# Patient Record
Sex: Female | Born: 1980 | Race: White | Hispanic: No | Marital: Married | Smoking: Former smoker
Health system: Southern US, Community
[De-identification: ages and names within clinical notes are randomized; demographics above are authoritative.]

## PROBLEM LIST (undated history)

## (undated) DIAGNOSIS — G4733 Obstructive sleep apnea (adult) (pediatric): Secondary | ICD-10-CM

## (undated) DIAGNOSIS — I10 Essential (primary) hypertension: Secondary | ICD-10-CM

## (undated) DIAGNOSIS — E119 Type 2 diabetes mellitus without complications: Secondary | ICD-10-CM

## (undated) DIAGNOSIS — R002 Palpitations: Secondary | ICD-10-CM

## (undated) HISTORY — DX: Palpitations: R00.2

## (undated) HISTORY — DX: Obstructive sleep apnea (adult) (pediatric): G47.33

## (undated) HISTORY — DX: Morbid (severe) obesity due to excess calories: E66.01

---

## 2004-09-14 ENCOUNTER — Emergency Department: Payer: Self-pay | Admitting: Emergency Medicine

## 2004-10-15 ENCOUNTER — Emergency Department: Payer: Self-pay | Admitting: Emergency Medicine

## 2004-11-27 ENCOUNTER — Emergency Department: Payer: Self-pay | Admitting: Emergency Medicine

## 2004-12-30 ENCOUNTER — Emergency Department: Payer: Self-pay | Admitting: Emergency Medicine

## 2005-08-08 ENCOUNTER — Emergency Department: Payer: Self-pay | Admitting: General Practice

## 2006-05-14 ENCOUNTER — Emergency Department: Payer: Self-pay | Admitting: Emergency Medicine

## 2006-08-27 ENCOUNTER — Emergency Department: Payer: Self-pay | Admitting: Emergency Medicine

## 2007-02-02 ENCOUNTER — Emergency Department: Payer: Self-pay | Admitting: Emergency Medicine

## 2007-02-14 ENCOUNTER — Emergency Department: Payer: Self-pay | Admitting: Emergency Medicine

## 2007-02-14 ENCOUNTER — Other Ambulatory Visit: Payer: Self-pay

## 2007-05-12 ENCOUNTER — Emergency Department: Payer: Self-pay | Admitting: Internal Medicine

## 2007-05-15 ENCOUNTER — Emergency Department: Payer: Self-pay | Admitting: Emergency Medicine

## 2007-08-30 ENCOUNTER — Emergency Department: Payer: Self-pay | Admitting: Emergency Medicine

## 2007-09-01 ENCOUNTER — Emergency Department: Payer: Self-pay | Admitting: Emergency Medicine

## 2007-11-06 ENCOUNTER — Emergency Department: Payer: Self-pay | Admitting: Emergency Medicine

## 2007-11-27 ENCOUNTER — Emergency Department: Payer: Self-pay | Admitting: Emergency Medicine

## 2008-01-14 ENCOUNTER — Emergency Department: Payer: Self-pay | Admitting: Emergency Medicine

## 2008-03-12 ENCOUNTER — Emergency Department: Payer: Self-pay | Admitting: Emergency Medicine

## 2008-03-26 ENCOUNTER — Ambulatory Visit: Payer: Self-pay | Admitting: Certified Nurse Midwife

## 2008-06-12 ENCOUNTER — Observation Stay: Payer: Self-pay

## 2008-06-17 ENCOUNTER — Observation Stay: Payer: Self-pay | Admitting: Obstetrics and Gynecology

## 2008-06-18 ENCOUNTER — Ambulatory Visit: Payer: Self-pay | Admitting: Obstetrics and Gynecology

## 2008-07-31 ENCOUNTER — Ambulatory Visit: Payer: Self-pay | Admitting: Certified Nurse Midwife

## 2008-08-01 ENCOUNTER — Emergency Department: Payer: Self-pay | Admitting: Emergency Medicine

## 2008-08-01 ENCOUNTER — Observation Stay: Payer: Self-pay | Admitting: Obstetrics and Gynecology

## 2008-08-02 ENCOUNTER — Ambulatory Visit: Payer: Self-pay | Admitting: Certified Nurse Midwife

## 2008-08-03 ENCOUNTER — Observation Stay: Payer: Self-pay | Admitting: Obstetrics and Gynecology

## 2008-08-06 ENCOUNTER — Observation Stay: Payer: Self-pay | Admitting: Obstetrics and Gynecology

## 2008-08-09 ENCOUNTER — Observation Stay: Payer: Self-pay | Admitting: Obstetrics and Gynecology

## 2008-08-18 ENCOUNTER — Observation Stay: Payer: Self-pay

## 2008-08-26 ENCOUNTER — Observation Stay: Payer: Self-pay | Admitting: Obstetrics and Gynecology

## 2008-08-31 ENCOUNTER — Observation Stay: Payer: Self-pay | Admitting: Obstetrics and Gynecology

## 2008-09-02 ENCOUNTER — Ambulatory Visit: Payer: Self-pay

## 2008-09-03 ENCOUNTER — Inpatient Hospital Stay: Payer: Self-pay | Admitting: Obstetrics and Gynecology

## 2008-09-08 ENCOUNTER — Emergency Department: Payer: Self-pay | Admitting: Emergency Medicine

## 2008-10-30 ENCOUNTER — Emergency Department: Payer: Self-pay | Admitting: Emergency Medicine

## 2008-12-30 ENCOUNTER — Inpatient Hospital Stay: Payer: Self-pay | Admitting: Unknown Physician Specialty

## 2009-08-27 ENCOUNTER — Ambulatory Visit: Payer: Self-pay

## 2010-06-02 ENCOUNTER — Ambulatory Visit: Payer: Self-pay | Admitting: Family Medicine

## 2010-06-11 ENCOUNTER — Inpatient Hospital Stay: Payer: Self-pay | Admitting: Obstetrics and Gynecology

## 2010-10-18 ENCOUNTER — Observation Stay: Payer: Self-pay | Admitting: Obstetrics and Gynecology

## 2010-10-27 ENCOUNTER — Ambulatory Visit: Payer: Self-pay | Admitting: Obstetrics and Gynecology

## 2010-10-28 ENCOUNTER — Inpatient Hospital Stay: Payer: Self-pay | Admitting: Obstetrics and Gynecology

## 2012-02-04 ENCOUNTER — Ambulatory Visit: Payer: Self-pay | Admitting: Primary Care

## 2012-06-01 ENCOUNTER — Emergency Department: Payer: Self-pay | Admitting: Emergency Medicine

## 2012-06-29 ENCOUNTER — Emergency Department: Payer: Self-pay | Admitting: Emergency Medicine

## 2015-10-09 ENCOUNTER — Encounter: Payer: Self-pay | Admitting: Emergency Medicine

## 2015-10-09 ENCOUNTER — Emergency Department
Admission: EM | Admit: 2015-10-09 | Discharge: 2015-10-09 | Disposition: A | Discharge status: Home / Self Care | Payer: Self-pay | Attending: Emergency Medicine | Admitting: Emergency Medicine

## 2015-10-09 DIAGNOSIS — F17200 Nicotine dependence, unspecified, uncomplicated: Secondary | ICD-10-CM | POA: Insufficient documentation

## 2015-10-09 DIAGNOSIS — N649 Disorder of breast, unspecified: Secondary | ICD-10-CM

## 2015-10-09 DIAGNOSIS — L03811 Cellulitis of head [any part, except face]: Secondary | ICD-10-CM | POA: Insufficient documentation

## 2015-10-09 DIAGNOSIS — L989 Disorder of the skin and subcutaneous tissue, unspecified: Secondary | ICD-10-CM | POA: Insufficient documentation

## 2015-10-09 DIAGNOSIS — L988 Other specified disorders of the skin and subcutaneous tissue: Secondary | ICD-10-CM

## 2015-10-09 MED ORDER — CEPHALEXIN 500 MG PO CAPS: 500.0000 mg | ORAL_CAPSULE | Freq: Four times a day (QID) | ORAL | Status: DC

## 2015-10-09 NOTE — ED Provider Notes (Signed)
[facility] Emergency Department Provider Note  ____________________________________________  Time seen: Approximately 7:22 PM  I have reviewed the triage vital signs and the nursing notes.   HISTORY  Chief Complaint Abscess    HPI Rose Tate is a 35 y.o. female who presents to emergency department complaining of 3 "bumps" to the back of her head. Patient states that these have developed over the last 2-3 days. They're tender to touch. One area has used a small amount of "clear fluid." Patient denies any fevers or chills, nausea or vomiting, headache, neck pain. Patient denies any history of recurrent skin infections. Patient is not taking any medications prior to arrival.  Patient also endorses a lesion underneath the right breast. Patient states this is been coming and going 5 months. Patient states that area will swell and she will "pop it." Patient states that all go away and then come back. It is always in the same location. Patient denies any swelling or drainage today at this visit. She denies any pain in this area at today's visit. Patient has not noticed any drainage from the nipple. She denies any axillary pain or swelling.   History reviewed. No pertinent past medical history.  There are no active problems to display for this patient.   History reviewed. No pertinent past surgical history.  Current Outpatient Rx  Name  Route  Sig  Dispense  Refill  . cephALEXin (KEFLEX) 500 MG capsule   Oral   Take 1 capsule (500 mg total) by mouth 4 (four) times daily.   28 capsule   0     Allergies Review of patient's allergies indicates no known allergies.  No family history on file.  Social History Social History  Substance Use Topics  . Smoking status: Current Every Day Smoker  . Smokeless tobacco: None  . Alcohol Use: Yes     Review of Systems  Constitutional: No fever/chills Cardiovascular: no chest pain. Respiratory: no cough. No  SOB. Gastrointestinal: No abdominal pain.  No nausea, no vomiting.  No diarrhea.  No constipation. Musculoskeletal: Negative for musculoskeletal pain. Skin: Positive for skin lesions to the back of the head. Positive for recurrent skin lesion underneath right breast. Neurological: Negative for headaches, focal weakness or numbness. 10-point ROS otherwise negative.  ____________________________________________   PHYSICAL EXAM:  VITAL SIGNS: ED Triage Vitals  Enc Vitals Group     BP 10/09/15 1848 143/95 mmHg     Pulse Rate 10/09/15 1848 79     Resp 10/09/15 1848 18     Temp 10/09/15 1848 98.6 F (37 C)     Temp Source 10/09/15 1848 Oral     SpO2 10/09/15 1848 99 %     Weight 10/09/15 1848 250 lb (113.399 kg)     Height 10/09/15 1848 5\' 7"  (1.702 m)     Head Cir --      Peak Flow --      Pain Score 10/09/15 1848 6     Pain Loc --      Pain Edu? --      Excl. in GC? --      Constitutional: Alert and oriented. Well appearing and in no acute distress. Eyes: Conjunctivae are normal. PERRL. EOMI. Head: Atraumatic. Hematological/Lymphatic/Immunilogical: No cervical lymphadenopathy. No axillary lymphadenopathy on the right side. Cardiovascular: Normal rate, regular rhythm. Normal S1 and S2.  Good peripheral circulation. Respiratory: Normal respiratory effort without tachypnea or retractions. Lungs CTAB. Good air entry to the bases with no  decreased or absent breath sounds. Musculoskeletal: Full range of motion to all extremities. No gross deformities appreciated. Neurologic:  Normal speech and language. No gross focal neurologic deficits are appreciated.  Skin:  Skin is warm, dry and intact. Erythematous and edematous skin lesions noted to the occipital region of the skull as well as posterior neck. These are tender to palpation. No fluctuance is noted. No drainage is noted. There is a mild abrasion appearing lesion underneath right breast. No edema, erythema, or drainage is noted.  Palpation reveals no firmness. No fluctuance. Breast exam reveals no lesions or nodules. No discharge from the nipple. Breast exam is performed with chaperone, Nellie, RN. Psychiatric: Mood and affect are normal. Speech and behavior are normal. Patient exhibits appropriate insight and judgement.   ____________________________________________   LABS (all labs ordered are listed, but only abnormal results are displayed)  Labs Reviewed - No data to display ____________________________________________  EKG   ____________________________________________  RADIOLOGY   No results found.  ____________________________________________    PROCEDURES  Procedure(s) performed:       Medications - No data to display   ____________________________________________   INITIAL IMPRESSION / ASSESSMENT AND PLAN / ED COURSE  Pertinent labs & imaging results that were available during my care of the patient were reviewed by me and considered in my medical decision making (see chart for details).  Patient's diagnosis is consistent with cellulitis to the occipital/posterior neck region. This will be treated with antibiotics. At this time there is no erythema or edema underneath right breast to suggest cellulitis or other skin infection. No lesions are identified to palpation of breast tissue on breast exam. Patient is encouraged to follow-up with OB/GYN for this complaint.. Patient will be discharged home with prescriptions for antibiotics for cellulitis. Patient is to follow up with primary care and OB/GYN as needed or otherwise directed. Patient is given ED precautions to return to the ED for any worsening or new symptoms.     ____________________________________________  FINAL CLINICAL IMPRESSION(S) / ED DIAGNOSES  Final diagnoses:  Cellulitis of head except face  Lesion of skin of breast      NEW MEDICATIONS STARTED DURING THIS VISIT:  New Prescriptions   CEPHALEXIN (KEFLEX)  500 MG CAPSULE    Take 1 capsule (500 mg total) by mouth 4 (four) times daily.        This chart was dictated using voice recognition software/Dragon. Despite best efforts to proofread, errors can occur which can change the meaning. Any change was purely unintentional.    Racheal Patches, PA-C 10/09/15 1935  Emily Filbert, MD 10/09/15 2038

## 2015-10-09 NOTE — ED Notes (Signed)
States she noticed several small abscess areas vs insect bites to back of head.neck and under breast

## 2015-10-09 NOTE — Discharge Instructions (Signed)

## 2015-12-30 ENCOUNTER — Encounter: Payer: Self-pay | Admitting: Emergency Medicine

## 2015-12-30 ENCOUNTER — Emergency Department
Admission: EM | Admit: 2015-12-30 | Discharge: 2015-12-30 | Disposition: A | Discharge status: Home / Self Care | Payer: Self-pay | Attending: Emergency Medicine | Admitting: Emergency Medicine

## 2015-12-30 DIAGNOSIS — E119 Type 2 diabetes mellitus without complications: Secondary | ICD-10-CM | POA: Insufficient documentation

## 2015-12-30 DIAGNOSIS — L03811 Cellulitis of head [any part, except face]: Secondary | ICD-10-CM | POA: Insufficient documentation

## 2015-12-30 HISTORY — DX: Type 2 diabetes mellitus without complications: E11.9

## 2015-12-30 MED ORDER — MUPIROCIN 2 % EX OINT: TOPICAL_OINTMENT | CUTANEOUS | 0 refills | Status: DC

## 2015-12-30 MED ORDER — SULFAMETHOXAZOLE-TRIMETHOPRIM 400-80 MG PO TABS: 1.0000 | ORAL_TABLET | Freq: Two times a day (BID) | ORAL | 0 refills | Status: DC

## 2015-12-30 NOTE — ED Notes (Signed)
See triage note.states she developed 3 possible abscess areas to back of head about 2 weeks ago  States the one near her neck is more painful

## 2015-12-30 NOTE — ED Triage Notes (Signed)
Patient reports that she has 3 "knots" on her head that she noticed about 2 weeks ago. Patient report that one of the areas has started causing her pain. Patient states that she was seen here about a month ago for the same and was diagnosed with a staph infection and cellulitis and was given antibiotics. Patient reports that the areas had improved with the antibiotics.

## 2015-12-30 NOTE — Discharge Instructions (Signed)
Take medication as prescribed. Avoid scratchy the area.Keep clean.  Follow up with your primary care physician this week as needed. Return to Urgent care for new or worsening concerns.

## 2015-12-30 NOTE — ED Provider Notes (Signed)
[facility] Emergency Department Provider Note ____________________________________________  Time seen: Approximately 7:38 AM  I have reviewed the triage vital signs and the nursing notes.   HISTORY  Chief Complaint Abscess   HPI Rose Tate is a 35 y.o. female presents today for the complaints of 3 knots to her scalp. Patient reports she has had a history of similar in the past and was diagnosed with cellulitis and staph infection. Patient reports from her last visit in the ER in May she was treated with oral cephalexin and states that the areas did improve but did not fully resolve. Patient reports that she did "pop "one of the areas last week and was able to get a small amount of drainage out.. Patient denies any other drainage. Patient states that the areas are tender to touch. Denies any other pain or injury. Denies any other skin changes. Denies head injury or loss consciousness.  Denies headache, nausea, vomiting, diarrhea, neck pain, vision changes, fevers or chills. Reports continues to eat and drink well.   Patient's last menstrual period was 12/26/2015 (approximate). Denies concerns of pregnancy   Past Medical History:  Diagnosis Date  . Diabetes mellitus without complication (HCC)    pre diabetes    There are no active problems to display for this patient.   Past Surgical History:  Procedure Laterality Date  . CESAREAN SECTION     times 3    No current facility-administered medications for this encounter.   Current Outpatient Prescriptions:  .  cephALEXin (KEFLEX) 500 MG capsule, Take 1 capsule (500 mg total) by mouth 4 (four) times daily., Disp: 28 capsule, Rfl: 0 .  mupirocin ointment (BACTROBAN) 2 %, Apply three times a day for 5 days., Disp: 22 g, Rfl: 0 .  sulfamethoxazole-trimethoprim (BACTRIM) 400-80 MG tablet, Take 1 tablet by mouth 2 (two) times daily., Disp: 20 tablet, Rfl: 0  Allergies Review of patient's allergies  indicates no known allergies.  No family history on file.  Social History Social History  Substance Use Topics  . Smoking status: Current Every Day Smoker  . Smokeless tobacco: Never Used  . Alcohol use Yes     Comment: occasionally    Review of Systems Constitutional: No fever/chills Eyes: No visual changes. ENT: No sore throat. Cardiovascular: Denies chest pain. Respiratory: Denies shortness of breath. Gastrointestinal: No abdominal pain.  No nausea, no vomiting.  No diarrhea.  No constipation. Genitourinary: Negative for dysuria. Musculoskeletal: Negative for back pain. Skin: Negative for rash.As above. Neurological: Negative for headaches, focal weakness or numbness.  10-point ROS otherwise negative.  ____________________________________________   PHYSICAL EXAM:  VITAL SIGNS: ED Triage Vitals [12/30/15 0655]  Enc Vitals Group     BP (!) 174/86     Pulse Rate 84     Resp 18     Temp 98.2 F (36.8 C)     Temp Source Oral     SpO2 96 %     Weight 260 lb (117.9 kg)     Height 5\' 8"  (1.727 m)     Head Circumference      Peak Flow      Pain Score 7     Pain Loc      Pain Edu?      Excl. in GC?    Today's Vitals   12/30/15 0655 12/30/15 0751  BP: (!) 174/86 (!) 142/89  Pulse: 84 75  Resp: 18   Temp: 98.2 F (36.8 C) 98.9 F (37.2 C)  TempSrc: Oral Oral  SpO2: 96% 95%  Weight: 117.9 kg   Height:  (1.727 m)   PainSc: 7       Constitutional: Alert and oriented. Well appearing and in no acute distress. Eyes: Conjunctivae are normal. PERRL. EOMI. ENT      Head: Normocephalic and atraumatic.      Nose: No congestion/rhinnorhea.      Mouth/Throat: Mucous membranes are moist.Oropharynx non-erythematous. Neck: No stridor. Supple without meningismus.  Hematological/Lymphatic/Immunilogical: No cervical lymphadenopathy. Cardiovascular: Normal rate, regular rhythm. Grossly normal heart sounds.  Good peripheral circulation. Respiratory: Normal  respiratory effort without tachypnea nor retractions. Breath sounds are clear and equal bilaterally. No wheezes/rales/rhonchi.. Gastrointestinal: Soft and nontender. No distention. Normal Bowel sounds. No CVA tenderness. Musculoskeletal:  Nontender with normal range of motion in all extremities. No midline cervical, thoracic or lumbar tenderness to palpation.  Neurologic:  Normal speech and language. Speech is normal. No gait instability.  Skin:  Skin is warm, dry and intact. No rash noted. Except: Patient with 1 lesion to anterior scalp and 2 lesions to posterior occiput scalp with mild induration, no fluctuance, no active drainage, mild yellowish crusting on top of each lesion, mild erythema directly at lesion site, minimal tenderness to direct palpation, no cervical tenderness. No incision and drainage indicated. Psychiatric: Mood and affect are normal. Speech and behavior are normal. Patient exhibits appropriate insight and judgment   ___________________________________________   LABS (all labs ordered are listed, but only abnormal results are displayed)  Labs Reviewed - No data to display  PROCEDURES Procedures    INITIAL IMPRESSION / ASSESSMENT AND PLAN / ED COURSE  Pertinent labs & imaging results that were available during my care of the patient were reviewed by me and considered in my medical decision making (see chart for details).  Well-appearing patient. No acute distress. Present for the complaints of scalp lesions. Reports history of similar in the past. Patient reports she has been "picking" at these areas. No active drainage. No indication for I&D. Clinical appearance consistent with lesion with mild cellulitis. Counseled regarding keeping clean and will treat with oral Bactrim as well as topical Bactroban. Encourage close monitoring and PCP follow-up.Discussed indication, risks and benefits of medications with patient.  Discussed follow up with Primary care physician this  week. Discussed follow up and return parameters including no resolution or any worsening concerns. Patient verbalized understanding and agreed to plan.   ____________________________________________   FINAL CLINICAL IMPRESSION(S) / ED DIAGNOSES  Final diagnoses:  Abscess or cellulitis of scalp     Discharge Medication List as of 12/30/2015  7:49 AM    START taking these medications   Details  mupirocin ointment (BACTROBAN) 2 % Apply three times a day for 5 days., Print    sulfamethoxazole-trimethoprim (BACTRIM) 400-80 MG tablet Take 1 tablet by mouth 2 (two) times daily., Starting Tue 12/30/2015, Print        Note: This dictation was prepared with Dragon dictation along with smaller phrase technology. Any transcriptional errors that result from this process are unintentional.    Clinical Course      Renford Dills, NP 12/30/15 [phone]

## 2016-03-03 ENCOUNTER — Encounter: Payer: Self-pay | Admitting: *Deleted

## 2016-03-03 ENCOUNTER — Emergency Department
Admission: EM | Admit: 2016-03-03 | Discharge: 2016-03-03 | Disposition: A | Discharge status: Home / Self Care | Payer: Self-pay | Attending: Emergency Medicine | Admitting: Emergency Medicine

## 2016-03-03 DIAGNOSIS — R42 Dizziness and giddiness: Secondary | ICD-10-CM | POA: Insufficient documentation

## 2016-03-03 DIAGNOSIS — R51 Headache: Secondary | ICD-10-CM | POA: Insufficient documentation

## 2016-03-03 DIAGNOSIS — R110 Nausea: Secondary | ICD-10-CM | POA: Insufficient documentation

## 2016-03-03 DIAGNOSIS — F17200 Nicotine dependence, unspecified, uncomplicated: Secondary | ICD-10-CM | POA: Insufficient documentation

## 2016-03-03 DIAGNOSIS — I10 Essential (primary) hypertension: Secondary | ICD-10-CM | POA: Insufficient documentation

## 2016-03-03 DIAGNOSIS — Z79899 Other long term (current) drug therapy: Secondary | ICD-10-CM | POA: Insufficient documentation

## 2016-03-03 DIAGNOSIS — E119 Type 2 diabetes mellitus without complications: Secondary | ICD-10-CM | POA: Insufficient documentation

## 2016-03-03 NOTE — ED Notes (Signed)
Pt with headache and dizziness for four days along with nausea. Rates headache at a 6/10, describes as pressure. Pt had not tried any OTC meds for headache.

## 2016-03-03 NOTE — ED Triage Notes (Addendum)
Pt reports a headache with nausea/dizziness for 4 days.  Pt states checking blood pressure at home and it has been high.  No hx of htn.  No chest pain.  Pt took no otc meds for headache.  Pt alert.  Speech clear.

## 2016-03-03 NOTE — ED Provider Notes (Signed)
[facility] Emergency Department Provider Note    ____________________________________________   I have reviewed the triage vital signs and the nursing notes.   HISTORY  Chief Complaint Headache; Nausea; and Hypertension   History limited by: Not Limited   HPI Rose Tate is a 35 y.o. female who presents to the emergency department today because of concerns for high blood pressure and feeling unwell. She states that she has felt unwell for the past 3-4 days. She has felt somewhat dizzy. She has also had associated headache. She had been checking her blood pressure at home with her boyfriend's blood pressure cuff. She noted elevated blood pressure 140/100. She had similar readings for the past few days. She denies any chest pain.   Past Medical History:  Diagnosis Date  . Diabetes mellitus without complication (HCC)    pre diabetes    There are no active problems to display for this patient.   Past Surgical History:  Procedure Laterality Date  . CESAREAN SECTION     times 3    Prior to Admission medications   Medication Sig Start Date End Date Taking? Authorizing Provider  cephALEXin (KEFLEX) 500 MG capsule Take 1 capsule (500 mg total) by mouth 4 (four) times daily. 10/09/15   Delorise RoyalsJonathan D Cuthriell, PA-C  mupirocin ointment (BACTROBAN) 2 % Apply three times a day for 5 days. 12/30/15   Renford DillsLindsey Miller, NP  sulfamethoxazole-trimethoprim (BACTRIM) 400-80 MG tablet Take 1 tablet by mouth 2 (two) times daily. 12/30/15   Renford DillsLindsey Miller, NP    Allergies Review of patient's allergies indicates no known allergies.  No family history on file.  Social History Social History  Substance Use Topics  . Smoking status: Current Every Day Smoker  . Smokeless tobacco: Never Used  . Alcohol use Yes     Comment: occasionally    Review of Systems  Constitutional: Negative for fever. Cardiovascular: Negative for chest pain. Respiratory: Negative for  shortness of breath. Gastrointestinal: Negative for abdominal pain, vomiting and diarrhea. Genitourinary: Negative for dysuria. Musculoskeletal: Negative for back pain. Skin: Negative for rash. Neurological: Negative for headaches, focal weakness or numbness.  10-point ROS otherwise negative.  ____________________________________________   PHYSICAL EXAM:  VITAL SIGNS: ED Triage Vitals  Enc Vitals Group     BP 03/03/16 1608 (!) 139/97     Pulse Rate 03/03/16 1608 83     Resp 03/03/16 1608 20     Temp 03/03/16 1608 98 F (36.7 C)     Temp Source 03/03/16 1608 Oral     SpO2 03/03/16 1608 98 %     Weight 03/03/16 1609 260 lb (117.9 kg)     Height 03/03/16 1609 5\' 8"  (1.727 m)     Head Circumference --      Peak Flow --      Pain Score 03/03/16 1609 6   Constitutional: Alert and oriented. Well appearing and in no distress. Eyes: Conjunctivae are normal. Normal extraocular movements. ENT   Head: Normocephalic and atraumatic.   Nose: No congestion/rhinnorhea.   Mouth/Throat: Mucous membranes are moist.   Neck: No stridor. Hematological/Lymphatic/Immunilogical: No cervical lymphadenopathy. Cardiovascular: Normal rate, regular rhythm.  No murmurs, rubs, or gallops. Respiratory: Normal respiratory effort without tachypnea nor retractions. Breath sounds are clear and equal bilaterally. No wheezes/rales/rhonchi. Gastrointestinal: Soft and nontender. No distention.  Genitourinary: Deferred Musculoskeletal: Normal range of motion in all extremities. No lower extremity edema. Neurologic:  Normal speech and language. No gross focal neurologic deficits are appreciated.  Skin:  Skin is warm, dry and intact. No rash noted. Psychiatric: Mood and affect are normal. Speech and behavior are normal. Patient exhibits appropriate insight and judgment.  ____________________________________________    LABS (pertinent positives/negatives)  Labs Reviewed - No data to  display   ____________________________________________   EKG  None  ____________________________________________    RADIOLOGY  None  ____________________________________________   PROCEDURES  Procedures  ____________________________________________   INITIAL IMPRESSION / ASSESSMENT AND PLAN / ED COURSE  Pertinent labs & imaging results that were available during my care of the patient were reviewed by me and considered in my medical decision making (see chart for details).  Patient presented to the emergency department today because of concerns for high blood pressure. Patient's blood pressure in the emergency department mildly elevated. I had a discussion with the patient about goals of care with hypertension. Given the mild elevation I did tell her that we would certainly be hesitant to put her on antihypertensives without diet and exercise attempted first. Will give patient primary care information. Will give patient information on the DASH diet. ____________________________________________   FINAL CLINICAL IMPRESSION(S) / ED DIAGNOSES  Final diagnoses:  Hypertension, unspecified type     Note: This dictation was prepared with Dragon dictation. Any transcriptional errors that result from this process are unintentional    Phineas Semen, MD 03/03/16 1844

## 2016-03-03 NOTE — Discharge Instructions (Signed)
Please seek medical attention for any high fevers, chest pain, shortness of breath, change in behavior, persistent vomiting, bloody stool or any other new or concerning symptoms.  

## 2016-08-11 ENCOUNTER — Emergency Department
Admission: EM | Admit: 2016-08-11 | Discharge: 2016-08-11 | Disposition: A | Discharge status: Home / Self Care | Payer: Self-pay | Attending: Emergency Medicine | Admitting: Emergency Medicine

## 2016-08-11 DIAGNOSIS — W268XXA Contact with other sharp object(s), not elsewhere classified, initial encounter: Secondary | ICD-10-CM | POA: Insufficient documentation

## 2016-08-11 DIAGNOSIS — F17200 Nicotine dependence, unspecified, uncomplicated: Secondary | ICD-10-CM | POA: Insufficient documentation

## 2016-08-11 DIAGNOSIS — Y929 Unspecified place or not applicable: Secondary | ICD-10-CM | POA: Insufficient documentation

## 2016-08-11 DIAGNOSIS — E1165 Type 2 diabetes mellitus with hyperglycemia: Secondary | ICD-10-CM | POA: Insufficient documentation

## 2016-08-11 DIAGNOSIS — Y999 Unspecified external cause status: Secondary | ICD-10-CM | POA: Insufficient documentation

## 2016-08-11 DIAGNOSIS — S61411A Laceration without foreign body of right hand, initial encounter: Secondary | ICD-10-CM | POA: Insufficient documentation

## 2016-08-11 DIAGNOSIS — Y9389 Activity, other specified: Secondary | ICD-10-CM | POA: Insufficient documentation

## 2016-08-11 MED ORDER — LIDOCAINE HCL (PF) 1 % IJ SOLN
INTRAMUSCULAR | Status: AC
  Administered 2016-08-11: 14:00:00
  Filled 2016-08-11: qty 5

## 2016-08-11 MED ORDER — OXYCODONE-ACETAMINOPHEN 5-325 MG PO TABS
1.0000 | ORAL_TABLET | Freq: Once | ORAL | Status: AC
  Administered 2016-08-11: 1 via ORAL
  Filled 2016-08-11: qty 1

## 2016-08-11 MED ORDER — OXYCODONE-ACETAMINOPHEN 5-325 MG PO TABS: 1.0000 | ORAL_TABLET | Freq: Four times a day (QID) | ORAL | 0 refills | Status: DC | PRN

## 2016-08-11 MED ORDER — BACITRACIN ZINC 500 UNIT/GM EX OINT
TOPICAL_OINTMENT | Freq: Once | CUTANEOUS | Status: AC
  Administered 2016-08-11: 1 via TOPICAL
  Filled 2016-08-11: qty 0.9

## 2016-08-11 NOTE — ED Provider Notes (Signed)
[facility] Emergency Department Provider Note   ____________________________________________   First MD Initiated Contact with Patient 08/11/16 1347     (approximate)  I have reviewed the triage vital signs and the nursing notes.   HISTORY  Chief Complaint Laceration    HPI Rose Tate is a 36 y.o. female laceration right hand. Patient states cut while washing a mild consent. Patient stated leading controlled with direct pressure. Patient denies loss of sensation or loss of function. Patient is right-hand dominant.Patient describes the pain as "achy". Patient is unsure tetanus status.   Past Medical History:  Diagnosis Date  . Diabetes mellitus without complication (HCC)    pre diabetes    There are no active problems to display for this patient.   Past Surgical History:  Procedure Laterality Date  . CESAREAN SECTION     times 3    Prior to Admission medications   Medication Sig Start Date End Date Taking? Authorizing Provider  cephALEXin (KEFLEX) 500 MG capsule Take 1 capsule (500 mg total) by mouth 4 (four) times daily. 10/09/15   Delorise RoyalsJonathan D Cuthriell, PA-C  mupirocin ointment (BACTROBAN) 2 % Apply three times a day for 5 days. 12/30/15   Renford DillsLindsey Miller, NP  oxyCODONE-acetaminophen (ROXICET) 5-325 MG tablet Take 1 tablet by mouth every 6 (six) hours as needed for moderate pain. 08/11/16   Joni Reiningonald K Sharina Petre, PA-C  sulfamethoxazole-trimethoprim (BACTRIM) 400-80 MG tablet Take 1 tablet by mouth 2 (two) times daily. 12/30/15   Renford DillsLindsey Miller, NP    Allergies Patient has no known allergies.  History reviewed. No pertinent family history.  Social History Social History  Substance Use Topics  . Smoking status: Current Every Day Smoker  . Smokeless tobacco: Never Used  . Alcohol use Yes     Comment: occasionally    Review of Systems Constitutional: No fever/chills Eyes: No visual changes. ENT: No sore throat. Cardiovascular: Denies  chest pain. Respiratory: Denies shortness of breath. Gastrointestinal: No abdominal pain.  No nausea, no vomiting.  No diarrhea.  No constipation. Genitourinary: Negative for dysuria. Musculoskeletal: Negative for back pain. Skin: Negative for rash. Right hand laceration Neurological: Negative for headaches, focal weakness or numbness. Endocrine:Diabetes ____________________________________________   PHYSICAL EXAM:  VITAL SIGNS: ED Triage Vitals  Enc Vitals Group     BP 08/11/16 1321 (!) 135/98     Pulse Rate 08/11/16 1321 83     Resp 08/11/16 1321 18     Temp 08/11/16 1321 97.9 F (36.6 C)     Temp Source 08/11/16 1321 Oral     SpO2 08/11/16 1321 97 %     Weight 08/11/16 1322 260 lb (117.9 kg)     Height 08/11/16 1320 5\' 8"  (1.727 m)     Head Circumference --      Peak Flow --      Pain Score 08/11/16 1320 2     Pain Loc --      Pain Edu? --      Excl. in GC? --     Constitutional: Alert and oriented. Well appearing and in no acute distress. Eyes: Conjunctivae are normal. PERRL. EOMI. Head: Atraumatic. Nose: No congestion/rhinnorhea. Mouth/Throat: Mucous membranes are moist.  Oropharynx non-erythematous. Neck: No stridor.  No cervical spine tenderness to palpation. Hematological/Lymphatic/Immunilogical: No cervical lymphadenopathy. Cardiovascular: Normal rate, regular rhythm. Grossly normal heart sounds.  Good peripheral circulation. Respiratory: Normal respiratory effort.  No retractions. Lungs CTAB. Gastrointestinal: Soft and nontender. No distention. No abdominal bruits. No  CVA tenderness. Musculoskeletal: No lower extremity tenderness nor edema.  No joint effusions. Neurologic:  Normal speech and language. No gross focal neurologic deficits are appreciated. No gait instability. Skin:  Skin is warm, dry and intact. No rash noted. Right hand laceration Psychiatric: Mood and affect are normal. Speech and behavior are  normal.  ____________________________________________   LABS (all labs ordered are listed, but only abnormal results are displayed)  Labs Reviewed - No data to display ____________________________________________  EKG   ____________________________________________  RADIOLOGY   ____________________________________________   PROCEDURES  Procedure(s) performed: LACERATION REPAIR Performed by: Joni Reining Authorized by: Joni Reining Consent: Verbal consent obtained. Risks and benefits: risks, benefits and alternatives were discussed Consent given by: patient Patient identity confirmed: provided demographic data Prepped and Draped in normal sterile fashion Wound explored  Laceration Location: Right hand  Laceration Length: 2cm  No Foreign Bodies seen or palpated  Anesthesia: local infiltration  Local anesthetic: lidocaine 1% without epinephrine  Anesthetic total: 4ml  Irrigation method: syringe Amount of cleaning: standard  Skin closure: 4-0 nylon   Number of sutures: 8 Technique: Interrupted Patient tolerance: Patient tolerated the procedure well with no immediate complications.   Procedures  Critical Care performed: No  ____________________________________________   INITIAL IMPRESSION / ASSESSMENT AND PLAN / ED COURSE  Pertinent labs & imaging results that were available during my care of the patient were reviewed by me and considered in my medical decision making (see chart for details).  Right hand laceration. Patient given discharge care instructions. Patient given a prescription for Percocet. Patient advised to have sutures removed in 10 days by her family doctor or this department. Given work note. Patient given a prescription for 3 days of Percocet      ____________________________________________   FINAL CLINICAL IMPRESSION(S) / ED DIAGNOSES  Final diagnoses:  Laceration of right hand without foreign body, initial encounter       NEW MEDICATIONS STARTED DURING THIS VISIT:  New Prescriptions   OXYCODONE-ACETAMINOPHEN (ROXICET) 5-325 MG TABLET    Take 1 tablet by mouth every 6 (six) hours as needed for moderate pain.     Note:  This document was prepared using Dragon voice recognition software and may include unintentional dictation errors.    Joni Reining, PA-C 08/11/16 1421    Jennye Moccasin, MD 08/11/16 [phone]

## 2016-08-11 NOTE — ED Triage Notes (Signed)
Pt was washing a mug and cut R hand. Pt has saturated paper towel on R hand. Unsure of tetanus.

## 2016-08-19 ENCOUNTER — Emergency Department
Admission: EM | Admit: 2016-08-19 | Discharge: 2016-08-19 | Disposition: A | Discharge status: Home / Self Care | Payer: Self-pay | Attending: Emergency Medicine | Admitting: Emergency Medicine

## 2016-08-19 ENCOUNTER — Encounter: Payer: Self-pay | Admitting: Emergency Medicine

## 2016-08-19 DIAGNOSIS — Z4802 Encounter for removal of sutures: Secondary | ICD-10-CM | POA: Insufficient documentation

## 2016-08-19 DIAGNOSIS — E119 Type 2 diabetes mellitus without complications: Secondary | ICD-10-CM | POA: Insufficient documentation

## 2016-08-19 DIAGNOSIS — F17200 Nicotine dependence, unspecified, uncomplicated: Secondary | ICD-10-CM | POA: Insufficient documentation

## 2016-08-19 NOTE — ED Notes (Signed)

## 2016-08-19 NOTE — ED Triage Notes (Signed)
Pt had stitches placed one week ago.  Here for removal. No site complications.

## 2016-08-19 NOTE — ED Provider Notes (Signed)
[facility] Emergency Department Provider Note  ____________________________________________  Time seen: Approximately 5:28 PM  I have reviewed the triage vital signs and the nursing notes.   HISTORY  Chief Complaint Suture / Staple Removal    HPI Rose BillRachael Tate Mariah MillingMorales is a 36 y.o. female who presents to emergency room complaining of laceration to the right thumb and second digit. This was closed in this department 8 days prior. Patient denies any complications with wound. Area is healing appropriately. No signs of infection. Patient is here for suture removal.   Past Medical History:  Diagnosis Date  . Diabetes mellitus without complication (HCC)    pre diabetes    There are no active problems to display for this patient.   Past Surgical History:  Procedure Laterality Date  . CESAREAN SECTION     times 3    Prior to Admission medications   Medication Sig Start Date End Date Taking? Authorizing Provider  cephALEXin (KEFLEX) 500 MG capsule Take 1 capsule (500 mg total) by mouth 4 (four) times daily. 10/09/15   Delorise RoyalsJonathan D Eliane Hammersmith, PA-C  mupirocin ointment (BACTROBAN) 2 % Apply three times a day for 5 days. 12/30/15   Renford DillsLindsey Miller, NP  oxyCODONE-acetaminophen (ROXICET) 5-325 MG tablet Take 1 tablet by mouth every 6 (six) hours as needed for moderate pain. 08/11/16   Joni Reiningonald K Smith, PA-C  sulfamethoxazole-trimethoprim (BACTRIM) 400-80 MG tablet Take 1 tablet by mouth 2 (two) times daily. 12/30/15   Renford DillsLindsey Miller, NP    Allergies Patient has no known allergies.  History reviewed. No pertinent family history.  Social History Social History  Substance Use Topics  . Smoking status: Current Every Day Smoker  . Smokeless tobacco: Never Used  . Alcohol use Yes     Comment: occasionally     Review of Systems  Constitutional: No fever/chills Cardiovascular: no chest pain. Respiratory: no cough. No SOB. Musculoskeletal: Negative for musculoskeletal  pain. Skin: Negative for rash, abrasions, lacerations, ecchymosis.Suture removal from thumb and second digit right hand Neurological: Negative for headaches, focal weakness or numbness. 10-point ROS otherwise negative.  ____________________________________________   PHYSICAL EXAM:  VITAL SIGNS: ED Triage Vitals  Enc Vitals Group     BP 08/19/16 1700 130/86     Pulse Rate 08/19/16 1700 80     Resp 08/19/16 1700 20     Temp 08/19/16 1700 98.7 F (37.1 C)     Temp Source 08/19/16 1700 Oral     SpO2 08/19/16 1700 98 %     Weight 08/19/16 1701 260 lb (117.9 kg)     Height 08/19/16 1701 5\' 8"  (1.727 m)     Head Circumference --      Peak Flow --      Pain Score 08/19/16 1702 4     Pain Loc --      Pain Edu? --      Excl. in GC? --      Constitutional: Alert and oriented. Well appearing and in no acute distress. Eyes: Conjunctivae are normal. PERRL. EOMI. Head: Atraumatic. Neck: No stridor.    Cardiovascular: Normal rate, regular rhythm. Normal S1 and S2.  Good peripheral circulation. Respiratory: Normal respiratory effort without tachypnea or retractions. Lungs CTAB. Good air entry to the bases with no decreased or absent breath sounds. Musculoskeletal: Full range of motion to all extremities. No gross deformities appreciated. Neurologic:  Normal speech and language. No gross focal neurologic deficits are appreciated.  Skin:  Skin is warm, dry and  intact. No rash noted.2 small lacerations noted. 1 to the thumb, one to second digit right hand. These are healing appropriately with no signs of infection. No drainage. Area is nontender to palpation. 8 sutures in place. Psychiatric: Mood and affect are normal. Speech and behavior are normal. Patient exhibits appropriate insight and judgement.   ____________________________________________   LABS (all labs ordered are listed, but only abnormal results are displayed)  Labs Reviewed - No data to  display ____________________________________________  EKG   ____________________________________________  RADIOLOGY   No results found.  ____________________________________________    PROCEDURES  Procedure(s) performed:    .Suture Removal Date/Time: 08/19/2016 5:42 PM Performed by: Gala Romney D Authorized by: Gala Romney D   Consent:    Consent obtained:  Verbal   Consent given by:  Patient   Risks discussed:  Pain Location:    Location:  Upper extremity   Upper extremity location:  Hand Procedure details:    Wound appearance:  Good wound healing   Number of sutures removed:  8 Post-procedure details:    Post-removal:  No dressing applied   Patient tolerance of procedure:  Tolerated well, no immediate complications      Medications - No data to display   ____________________________________________   INITIAL IMPRESSION / ASSESSMENT AND PLAN / ED COURSE  Pertinent labs & imaging results that were available during my care of the patient were reviewed by me and considered in my medical decision making (see chart for details).  Review of the [place] CSRS was performed in accordance of the [facility] prior to dispensing any controlled drugs.     Patient's diagnosis is consistent with suture removal. All sutures are removed. No signs of dehiscence or infection. No further follow-up necessary in the emergency department. Patient will follow-up with primary care as needed..  Patient is given ED precautions to return to the ED for any worsening or new symptoms.     ____________________________________________  FINAL CLINICAL IMPRESSION(S) / ED DIAGNOSES  Final diagnoses:  Visit for suture removal      NEW MEDICATIONS STARTED DURING THIS VISIT:  New Prescriptions   No medications on file        This chart was dictated using voice recognition software/Dragon. Despite best efforts to proofread, errors can occur which can change the  meaning. Any change was purely unintentional.    Racheal Patches, PA-C 08/19/16 1743    Merrily Brittle, MD 08/19/16 1901

## 2017-07-16 ENCOUNTER — Emergency Department
Admission: EM | Admit: 2017-07-16 | Discharge: 2017-07-17 | Disposition: A | Discharge status: Home / Self Care | Payer: Self-pay | Attending: Emergency Medicine | Admitting: Emergency Medicine

## 2017-07-16 ENCOUNTER — Encounter: Payer: Self-pay | Admitting: Emergency Medicine

## 2017-07-16 ENCOUNTER — Other Ambulatory Visit: Payer: Self-pay

## 2017-07-16 DIAGNOSIS — E119 Type 2 diabetes mellitus without complications: Secondary | ICD-10-CM | POA: Insufficient documentation

## 2017-07-16 DIAGNOSIS — Z79899 Other long term (current) drug therapy: Secondary | ICD-10-CM | POA: Insufficient documentation

## 2017-07-16 DIAGNOSIS — F17200 Nicotine dependence, unspecified, uncomplicated: Secondary | ICD-10-CM | POA: Insufficient documentation

## 2017-07-16 DIAGNOSIS — R52 Pain, unspecified: Secondary | ICD-10-CM

## 2017-07-16 DIAGNOSIS — R42 Dizziness and giddiness: Secondary | ICD-10-CM | POA: Insufficient documentation

## 2017-07-16 DIAGNOSIS — I10 Essential (primary) hypertension: Secondary | ICD-10-CM | POA: Insufficient documentation

## 2017-07-16 DIAGNOSIS — R102 Pelvic and perineal pain: Secondary | ICD-10-CM | POA: Insufficient documentation

## 2017-07-16 LAB — CBC
HEMATOCRIT: 37 % (ref 35.0–47.0)
Hemoglobin: 11.8 g/dL — ABNORMAL LOW (ref 12.0–16.0)
MCH: 23 pg — ABNORMAL LOW (ref 26.0–34.0)
MCHC: 31.8 g/dL — ABNORMAL LOW (ref 32.0–36.0)
MCV: 72.2 fL — ABNORMAL LOW (ref 80.0–100.0)
PLATELETS: 302 10*3/uL (ref 150–440)
RBC: 5.12 MIL/uL (ref 3.80–5.20)
RDW: 17.1 % — AB (ref 11.5–14.5)
WBC: 5 10*3/uL (ref 3.6–11.0)

## 2017-07-16 LAB — COMPREHENSIVE METABOLIC PANEL
ALK PHOS: 69 U/L (ref 38–126)
ALT: 25 U/L (ref 14–54)
AST: 29 U/L (ref 15–41)
Albumin: 4.1 g/dL (ref 3.5–5.0)
Anion gap: 9 (ref 5–15)
BILIRUBIN TOTAL: 0.4 mg/dL (ref 0.3–1.2)
BUN: 10 mg/dL (ref 6–20)
CALCIUM: 8.6 mg/dL — AB (ref 8.9–10.3)
CO2: 27 mmol/L (ref 22–32)
Chloride: 103 mmol/L (ref 101–111)
Creatinine, Ser: 0.77 mg/dL (ref 0.44–1.00)
GFR calc Af Amer: >60 mL/min (ref >60)
Glucose, Bld: 166 mg/dL — ABNORMAL HIGH (ref 65–99)
POTASSIUM: 3.8 mmol/L (ref 3.5–5.1)
Sodium: 139 mmol/L (ref 135–145)
TOTAL PROTEIN: 7.2 g/dL (ref 6.5–8.1)

## 2017-07-16 LAB — URINALYSIS, COMPLETE (UACMP) WITH MICROSCOPIC
BILIRUBIN URINE: NEGATIVE
GLUCOSE, UA: NEGATIVE mg/dL
HGB URINE DIPSTICK: NEGATIVE
Ketones, ur: NEGATIVE mg/dL
LEUKOCYTES UA: NEGATIVE
NITRITE: NEGATIVE
PH: 5 (ref 5.0–8.0)
Protein, ur: NEGATIVE mg/dL
Specific Gravity, Urine: 1.017 (ref 1.005–1.030)

## 2017-07-16 LAB — LIPASE, BLOOD: Lipase: 25 U/L (ref 11–51)

## 2017-07-16 LAB — PREGNANCY, URINE: PREG TEST UR: NEGATIVE

## 2017-07-16 NOTE — ED Notes (Signed)
Called Lab to inqquire on Lipase. Lab tech states they are running it now.

## 2017-07-16 NOTE — ED Triage Notes (Signed)
Pt ambulatory to triage in NAD, reports lower abd/pelvic pain, dark urine, malodorous urine ongoing for a year, but about 2 days ago developed dizziness.

## 2017-07-16 NOTE — ED Provider Notes (Signed)
[facility] Emergency Department Provider Note   ____________________________________________   First MD Initiated Contact with Patient 07/16/17 2326     (approximate)  I have reviewed the triage vital signs and the nursing notes.   HISTORY  Chief Complaint Dizziness and Abdominal Pain    HPI Rose Tate is a 37 y.o. female who presents to the ED from home with a chief complaint of pelvic pain, malodorous urine and dizziness.  Patient reports pelvic pain and malodorous urine for about 1 year.  Presents tonight because 2 days ago she developed dizziness which she describes as spinning sensation exacerbated by head movement.  Has had sinus congestion and pressure this week.  Denies associated fever, chills, chest pain, shortness of breath, abdominal pain, nausea, vomiting, diarrhea.  Denies recent travel or trauma.  Denies vaginal discharge or bleeding.  States last menstrual.  Last month was heavy with clots.  Practices sexual abstinence; last sexual intercourse 2 years ago.   Past Medical History:  Diagnosis Date  . Diabetes mellitus without complication (HCC)    pre diabetes    There are no active problems to display for this patient.   Past Surgical History:  Procedure Laterality Date  . CESAREAN SECTION     times 3    Prior to Admission medications   Medication Sig Start Date End Date Taking? Authorizing Provider  cephALEXin (KEFLEX) 500 MG capsule Take 1 capsule (500 mg total) by mouth 4 (four) times daily. 10/09/15   Cuthriell, Delorise Royals, PA-C  mupirocin ointment (BACTROBAN) 2 % Apply three times a day for 5 days. 12/30/15   Renford Dills, NP  oxyCODONE-acetaminophen (ROXICET) 5-325 MG tablet Take 1 tablet by mouth every 6 (six) hours as needed for moderate pain. 08/11/16   Joni Reining, PA-C  sulfamethoxazole-trimethoprim (BACTRIM) 400-80 MG tablet Take 1 tablet by mouth 2 (two) times daily. 12/30/15   Renford Dills, NP     Allergies Patient has no known allergies.  History reviewed. No pertinent family history.  Social History Social History   Tobacco Use  . Smoking status: Current Every Day Smoker  . Smokeless tobacco: Never Used  Substance Use Topics  . Alcohol use: Yes    Comment: occasionally  . Drug use: No    Review of Systems   Constitutional: No fever/chills. Eyes: No visual changes. ENT: No sore throat. Cardiovascular: Denies chest pain. Respiratory: Denies shortness of breath. Gastrointestinal: Positive for pelvic pain.  No abdominal pain.  No nausea, no vomiting.  No diarrhea.  No constipation. Genitourinary: Negative for dysuria. Musculoskeletal: Negative for back pain. Skin: Negative for rash. Neurological: Positive for dizziness.  Negative for headaches, focal weakness or numbness.   ____________________________________________   PHYSICAL EXAM:  VITAL SIGNS: ED Triage Vitals  Enc Vitals Group     BP 07/16/17 1928 (!) 185/112     Pulse Rate 07/16/17 1928 95     Resp 07/16/17 1928 16     Temp 07/16/17 1928 98.3 F (36.8 C)     Temp Source 07/16/17 1928 Oral     SpO2 07/16/17 1928 100 %     Weight 07/16/17 1929 267 lb (121.1 kg)     Height 07/16/17 1929 5\' 8"  (1.727 m)     Head Circumference --      Peak Flow --      Pain Score 07/16/17 1929 4     Pain Loc --      Pain Edu? --  Excl. in GC? --     Constitutional: Alert and oriented. Well appearing and in no acute distress. Eyes: Conjunctivae are normal. PERRL. EOMI. Head: Atraumatic.  Bilateral maxillary sinuses mildly tender to palpation. Ears: Mild fluid behind bilateral TMs. Nose: No congestion/rhinnorhea. Mouth/Throat: Mucous membranes are moist.  Oropharynx non-erythematous. Neck: No stridor.  No carotid bruits. Cardiovascular: Normal rate, regular rhythm. Grossly normal heart sounds.  Good peripheral circulation. Respiratory: Normal respiratory effort.  No retractions. Lungs  CTAB. Gastrointestinal: Obese.  Soft and nontender to light or deep palpation. No distention. No abdominal bruits. No CVA tenderness. Musculoskeletal: No lower extremity tenderness nor edema.  No joint effusions. Neurologic:  Normal speech and language. No gross focal neurologic deficits are appreciated. No gait instability. Skin:  Skin is warm, dry and intact. No rash noted. Psychiatric: Mood and affect are normal. Speech and behavior are normal.  ____________________________________________   LABS (all labs ordered are listed, but only abnormal results are displayed)  Labs Reviewed  WET PREP, GENITAL - Abnormal; Notable for the following components:      Result Value   WBC, Wet Prep HPF POC FEW (*)    All other components within normal limits  COMPREHENSIVE METABOLIC PANEL - Abnormal; Notable for the following components:   Glucose, Bld 166 (*)    Calcium 8.6 (*)    All other components within normal limits  CBC - Abnormal; Notable for the following components:   Hemoglobin 11.8 (*)    MCV 72.2 (*)    MCH 23.0 (*)    MCHC 31.8 (*)    RDW 17.1 (*)    All other components within normal limits  URINALYSIS, COMPLETE (UACMP) WITH MICROSCOPIC - Abnormal; Notable for the following components:   Color, Urine YELLOW (*)    APPearance HAZY (*)    Bacteria, UA RARE (*)    Squamous Epithelial / LPF 6-30 (*)    All other components within normal limits  CHLAMYDIA/NGC RT PCR ([facility] ONLY)  URINE CULTURE  LIPASE, BLOOD  PREGNANCY, URINE  POC URINE PREG, ED   ____________________________________________  EKG  ED ECG REPORT I, Thoma Paulsen J, the attending physician, personally viewed and interpreted this ECG.   Date: 07/16/2017  EKG Time: 1927  Rate: 96  Rhythm: normal EKG, normal sinus rhythm  Axis: Normal  Intervals:none  ST&T Change: Nonspecific  ____________________________________________  RADIOLOGY  ED MD interpretation: Normal ultrasound  Official radiology  report(s): [place] Pelvis Transvanginal Non-ob (tv Only)  Result Date: 07/17/2017 CLINICAL DATA:  Lower abdominal/pelvic pain. History of ovarian cysts. EXAM: TRANSABDOMINAL AND TRANSVAGINAL ULTRASOUND OF PELVIS DOPPLER ULTRASOUND OF OVARIES TECHNIQUE: Both transabdominal and transvaginal ultrasound examinations of the pelvis were performed. Transabdominal technique was performed for global imaging of the pelvis including uterus, ovaries, adnexal regions, and pelvic cul-de-sac. It was necessary to proceed with endovaginal exam following the transabdominal exam to visualize the uterus and ovaries. Color and duplex Doppler ultrasound was utilized to evaluate blood flow to the ovaries. Transvaginal exam attempted, but terminated the for completion by the patient due to discomfort. COMPARISON:  None. FINDINGS: Uterus Measurements: 12.3 x 5.7 x 6.7 cm. No fibroids or other mass visualized. Endometrium Thickness: 6 mm.  No focal abnormality visualized. Right ovary Measurements: 4.5 x 3.4 x 3.2 cm. Physiologic follicles. Normal appearance/no adnexal mass. No ovarian cysts. Left ovary Measurements: 3.2 x 2.4 x 2.2 cm. Normal appearance/no adnexal mass. Pulsed Doppler evaluation of both ovaries demonstrates normal low-resistance arterial and venous waveforms. Other findings No abnormal free  fluid. Technically limited exam due to body habitus. IMPRESSION: 1. No ovarian cyst or torsion. Normal sonographic appearance of both ovaries. 2. Prominent sized uterus but without focal abnormality. Electronically Signed   By: Rubye Oaks M.D.   On: 07/17/2017 01:18   US Pelvis Complete  Result Date: 07/17/2017 CLINICAL DATA:  Lower abdominal/pelvic pain. History of ovarian cysts. EXAM: TRANSABDOMINAL AND TRANSVAGINAL ULTRASOUND OF PELVIS DOPPLER ULTRASOUND OF OVARIES TECHNIQUE: Both transabdominal and transvaginal ultrasound examinations of the pelvis were performed. Transabdominal technique was performed for global imaging of  the pelvis including uterus, ovaries, adnexal regions, and pelvic cul-de-sac. It was necessary to proceed with endovaginal exam following the transabdominal exam to visualize the uterus and ovaries. Color and duplex Doppler ultrasound was utilized to evaluate blood flow to the ovaries. Transvaginal exam attempted, but terminated the for completion by the patient due to discomfort. COMPARISON:  None. FINDINGS: Uterus Measurements: 12.3 x 5.7 x 6.7 cm. No fibroids or other mass visualized. Endometrium Thickness: 6 mm.  No focal abnormality visualized. Right ovary Measurements: 4.5 x 3.4 x 3.2 cm. Physiologic follicles. Normal appearance/no adnexal mass. No ovarian cysts. Left ovary Measurements: 3.2 x 2.4 x 2.2 cm. Normal appearance/no adnexal mass. Pulsed Doppler evaluation of both ovaries demonstrates normal low-resistance arterial and venous waveforms. Other findings No abnormal free fluid. Technically limited exam due to body habitus. IMPRESSION: 1. No ovarian cyst or torsion. Normal sonographic appearance of both ovaries. 2. Prominent sized uterus but without focal abnormality. Electronically Signed   By: Rubye Oaks M.D.   On: 07/17/2017 01:18   US Pelvic Doppler (torsion R/o Or Mass Arterial Flow)  Result Date: 07/17/2017 CLINICAL DATA:  Lower abdominal/pelvic pain. History of ovarian cysts. EXAM: TRANSABDOMINAL AND TRANSVAGINAL ULTRASOUND OF PELVIS DOPPLER ULTRASOUND OF OVARIES TECHNIQUE: Both transabdominal and transvaginal ultrasound examinations of the pelvis were performed. Transabdominal technique was performed for global imaging of the pelvis including uterus, ovaries, adnexal regions, and pelvic cul-de-sac. It was necessary to proceed with endovaginal exam following the transabdominal exam to visualize the uterus and ovaries. Color and duplex Doppler ultrasound was utilized to evaluate blood flow to the ovaries. Transvaginal exam attempted, but terminated the for completion by the patient due  to discomfort. COMPARISON:  None. FINDINGS: Uterus Measurements: 12.3 x 5.7 x 6.7 cm. No fibroids or other mass visualized. Endometrium Thickness: 6 mm.  No focal abnormality visualized. Right ovary Measurements: 4.5 x 3.4 x 3.2 cm. Physiologic follicles. Normal appearance/no adnexal mass. No ovarian cysts. Left ovary Measurements: 3.2 x 2.4 x 2.2 cm. Normal appearance/no adnexal mass. Pulsed Doppler evaluation of both ovaries demonstrates normal low-resistance arterial and venous waveforms. Other findings No abnormal free fluid. Technically limited exam due to body habitus. IMPRESSION: 1. No ovarian cyst or torsion. Normal sonographic appearance of both ovaries. 2. Prominent sized uterus but without focal abnormality. Electronically Signed   By: Rubye Oaks M.D.   On: 07/17/2017 01:18    ____________________________________________   PROCEDURES  Procedure(s) performed:   Pelvic exam: External exam within normal limits without rashes, lesions or vesicles.  Speculum exam reveals scant white discharge.  Bimanual exam deferred because patient could barely tolerate speculum exam as she has been abstinent for 2 years.  Procedures  Critical Care performed: No  ____________________________________________   INITIAL IMPRESSION / ASSESSMENT AND PLAN / ED COURSE  As part of my medical decision making, I reviewed the following data within the electronic MEDICAL RECORD NUMBER Nursing notes reviewed and incorporated, Labs reviewed, Radiograph reviewed and  Notes from prior ED visits.   37 year old female who presents with chronic pelvic pain and dizziness. Differential diagnosis includes, but is not limited to, ovarian cyst, ovarian torsion, acute appendicitis, diverticulitis, urinary tract infection/pyelonephritis, endometriosis, bowel obstruction, colitis, renal colic, gastroenteritis, hernia, fibroids, endometriosis, pregnancy related pain including ectopic pregnancy, etc. Differential diagnosis  includes, but is not limited to, intracranial hemorrhage, meningitis/encephalitis, previous head trauma, cavernous venous thrombosis, tension headache, temporal arteritis, migraine or migraine equivalent, idiopathic intracranial hypertension, and non-specific headache.  Patient is neurologically intact without focal deficits.  Clinically dizziness sounds suspicious for vertigo.  Will obtain orthostatic vital signs.  Patient declines IV placement.  As for chronic pelvic pain, will attempt pelvic ultrasound to evaluate for pelvic pathology.  Updated patient of laboratory and urinalysis results which are unremarkable.   Clinical Course as of Jul 17 352  Wynelle Link Jul 17, 2017  0337 Blood pressure improved.  Patient feeling better.  Patient states she has noted elevated blood pressure when she has been to the emergency department before.  Will start her on low-dose antihypertensive.  Patient will follow up with her PCP next week.  Strict return precautions given.  Patient verbalizes understanding and agrees with plan of care.  [JS]  0354 Z-Pak prescribed per patient's request for her sinus infection.  [JS]    Clinical Course User Index [JS] Irean Hong, MD     ____________________________________________   FINAL CLINICAL IMPRESSION(S) / ED DIAGNOSES  Final diagnoses:  Pain  Dizziness  Pelvic pain in female  Essential hypertension     ED Discharge Orders    None       Note:  This document was prepared using Dragon voice recognition software and may include unintentional dictation errors.    Irean Hong, MD 07/17/17 [phone]

## 2017-07-16 NOTE — ED Notes (Signed)
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

## 2017-07-17 ENCOUNTER — Emergency Department: Payer: Self-pay

## 2017-07-17 LAB — WET PREP, GENITAL
Clue Cells Wet Prep HPF POC: NONE SEEN
SPERM: NONE SEEN
TRICH WET PREP: NONE SEEN
YEAST WET PREP: NONE SEEN

## 2017-07-17 LAB — CHLAMYDIA/NGC RT PCR (ARMC ONLY)
Chlamydia Tr: NOT DETECTED
N gonorrhoeae: NOT DETECTED

## 2017-07-17 MED ORDER — ONDANSETRON 4 MG PO TBDP
4.0000 mg | ORAL_TABLET | Freq: Once | ORAL | Status: AC
  Administered 2017-07-17: 4 mg via ORAL
  Filled 2017-07-17: qty 1

## 2017-07-17 MED ORDER — MECLIZINE HCL 25 MG PO TABS
50.0000 mg | ORAL_TABLET | Freq: Once | ORAL | Status: AC
  Administered 2017-07-17: 50 mg via ORAL
  Filled 2017-07-17: qty 2

## 2017-07-17 MED ORDER — ONDANSETRON 4 MG PO TBDP: 4.0000 mg | ORAL_TABLET | Freq: Three times a day (TID) | ORAL | 0 refills | Status: DC | PRN

## 2017-07-17 MED ORDER — MECLIZINE HCL 25 MG PO TABS: 25.0000 mg | ORAL_TABLET | Freq: Three times a day (TID) | ORAL | 0 refills | Status: DC | PRN

## 2017-07-17 MED ORDER — AZITHROMYCIN 250 MG PO TABS: 250.0000 mg | ORAL_TABLET | Freq: Every day | ORAL | 0 refills | Status: DC

## 2017-07-17 MED ORDER — CLONIDINE HCL 0.1 MG PO TABS
0.1000 mg | ORAL_TABLET | Freq: Once | ORAL | Status: AC
  Administered 2017-07-17: 0.1 mg via ORAL
  Filled 2017-07-17: qty 1

## 2017-07-17 MED ORDER — HYDROCHLOROTHIAZIDE 25 MG PO TABS: 25.0000 mg | ORAL_TABLET | Freq: Every day | ORAL | 0 refills | Status: DC

## 2017-07-17 MED ORDER — AZITHROMYCIN 500 MG PO TABS
500.0000 mg | ORAL_TABLET | Freq: Once | ORAL | Status: AC
  Administered 2017-07-17: 500 mg via ORAL
  Filled 2017-07-17: qty 1

## 2017-07-17 MED ORDER — MIDAZOLAM HCL 5 MG/5ML IJ SOLN
1.0000 mg | Freq: Once | INTRAMUSCULAR | Status: AC
  Administered 2017-07-17: 1 mg via INTRAVENOUS
  Filled 2017-07-17: qty 5

## 2017-07-17 NOTE — Discharge Instructions (Signed)
1.  Start HCTZ 25 mg daily for blood pressure.  Eat bananas and green leafy vegetables supplement your potassium while taking this medicine. 2.  You may take medicines as needed for dizziness and nausea (Antivert/Zofran #20). 3.  Return to the ER for worsening symptoms, persistent vomiting, lethargy, difficulty breathing or other concerns.

## 2017-07-18 LAB — URINE CULTURE

## 2017-07-19 ENCOUNTER — Emergency Department
Admission: EM | Admit: 2017-07-19 | Discharge: 2017-07-19 | Disposition: A | Discharge status: Home / Self Care | Payer: Self-pay | Attending: Emergency Medicine | Admitting: Emergency Medicine

## 2017-07-19 ENCOUNTER — Encounter: Payer: Self-pay | Admitting: Emergency Medicine

## 2017-07-19 DIAGNOSIS — F17210 Nicotine dependence, cigarettes, uncomplicated: Secondary | ICD-10-CM | POA: Insufficient documentation

## 2017-07-19 DIAGNOSIS — Z79899 Other long term (current) drug therapy: Secondary | ICD-10-CM | POA: Insufficient documentation

## 2017-07-19 DIAGNOSIS — R079 Chest pain, unspecified: Secondary | ICD-10-CM

## 2017-07-19 DIAGNOSIS — I10 Essential (primary) hypertension: Secondary | ICD-10-CM | POA: Insufficient documentation

## 2017-07-19 DIAGNOSIS — R05 Cough: Secondary | ICD-10-CM | POA: Insufficient documentation

## 2017-07-19 DIAGNOSIS — R42 Dizziness and giddiness: Secondary | ICD-10-CM | POA: Insufficient documentation

## 2017-07-19 DIAGNOSIS — E119 Type 2 diabetes mellitus without complications: Secondary | ICD-10-CM | POA: Insufficient documentation

## 2017-07-19 LAB — BASIC METABOLIC PANEL
Anion gap: 9 (ref 5–15)
BUN: 10 mg/dL (ref 6–20)
CHLORIDE: 102 mmol/L (ref 101–111)
CO2: 27 mmol/L (ref 22–32)
Calcium: 9 mg/dL (ref 8.9–10.3)
Creatinine, Ser: 0.71 mg/dL (ref 0.44–1.00)
GFR calc Af Amer: >60 mL/min (ref >60)
GFR calc non Af Amer: >60 mL/min (ref >60)
Glucose, Bld: 106 mg/dL — ABNORMAL HIGH (ref 65–99)
POTASSIUM: 3.5 mmol/L (ref 3.5–5.1)
SODIUM: 138 mmol/L (ref 135–145)

## 2017-07-19 LAB — CBC
HEMATOCRIT: 36.9 % (ref 35.0–47.0)
HEMOGLOBIN: 11.9 g/dL — AB (ref 12.0–16.0)
MCH: 23.1 pg — AB (ref 26.0–34.0)
MCHC: 32.4 g/dL (ref 32.0–36.0)
MCV: 71.3 fL — ABNORMAL LOW (ref 80.0–100.0)
Platelets: 310 10*3/uL (ref 150–440)
RBC: 5.17 MIL/uL (ref 3.80–5.20)
RDW: 17.3 % — ABNORMAL HIGH (ref 11.5–14.5)
WBC: 7.8 10*3/uL (ref 3.6–11.0)

## 2017-07-19 LAB — TROPONIN I

## 2017-07-19 NOTE — ED Notes (Signed)
Pt alert, oriented, ambulatory. No distress noted while walking to treatment room.

## 2017-07-19 NOTE — ED Triage Notes (Signed)
Pt presents with high blood pressure, pt just here for same on Sunday, started meds yesterday.

## 2017-07-19 NOTE — ED Provider Notes (Signed)
[facility] Emergency Department Provider Note  Time seen: 8:14 PM  I have reviewed the triage vital signs and the nursing notes.   HISTORY  Chief Complaint Hypertension    HPI Rose Tate is a 37 y.o. female with a past medical history of diabetes who presents to the emergency department for elevated blood pressure dizziness and chest discomfort.  According to the patient for the past several days she has been experiencing high blood pressure, was seen on Saturday here in the emergency department was started on hydrochlorothiazide which she began taking yesterday.  States today her blood pressure was elevated at work to 200 systolic, she became concerned, went to [facility] was told to follow-up with her doctor, followed up with her doctor by calling today they could not see her until mid March but referred her to the emergency department so the patient came here for evaluation.  Here the patient states she has been having some chest pressure and dizziness since this morning today.  States mild cough that started today as well.  Otherwise largely negative review of systems.   Past Medical History:  Diagnosis Date  . Diabetes mellitus without complication (HCC)    pre diabetes    There are no active problems to display for this patient.   Past Surgical History:  Procedure Laterality Date  . CESAREAN SECTION     times 3    Prior to Admission medications   Medication Sig Start Date End Date Taking? Authorizing Provider  azithromycin (ZITHROMAX) 250 MG tablet Take 1 tablet (250 mg total) by mouth daily. 07/17/17   Irean Hong, MD  cephALEXin (KEFLEX) 500 MG capsule Take 1 capsule (500 mg total) by mouth 4 (four) times daily. 10/09/15   Cuthriell, Delorise Royals, PA-C  hydrochlorothiazide (HYDRODIURIL) 25 MG tablet Take 1 tablet (25 mg total) by mouth daily. 07/17/17   Irean Hong, MD  meclizine (ANTIVERT) 25 MG tablet Take 1 tablet (25 mg total) by mouth 3  (three) times daily as needed for dizziness or nausea. 07/17/17   Irean Hong, MD  mupirocin ointment (BACTROBAN) 2 % Apply three times a day for 5 days. 12/30/15   Renford Dills, NP  ondansetron (ZOFRAN ODT) 4 MG disintegrating tablet Take 1 tablet (4 mg total) by mouth every 8 (eight) hours as needed for nausea or vomiting. 07/17/17   Irean Hong, MD  oxyCODONE-acetaminophen (ROXICET) 5-325 MG tablet Take 1 tablet by mouth every 6 (six) hours as needed for moderate pain. 08/11/16   Joni Reining, PA-C  sulfamethoxazole-trimethoprim (BACTRIM) 400-80 MG tablet Take 1 tablet by mouth 2 (two) times daily. 12/30/15   Renford Dills, NP    No Known Allergies  No family history on file.  Social History Social History   Tobacco Use  . Smoking status: Current Every Day Smoker  . Smokeless tobacco: Never Used  Substance Use Topics  . Alcohol use: Yes    Comment: occasionally  . Drug use: No    Review of Systems Constitutional: Negative for fever.  Positive for intermittent dizziness today. Eyes: Negative for visual complaints ENT: Negative for recent illness/congestion Cardiovascular: Chest pressure starting today. Respiratory: Negative for shortness of breath.  Mild cough. Gastrointestinal: Negative for abdominal pain, vomiting  Genitourinary: Negative for urinary compaints Musculoskeletal: Negative for leg pain or swelling. Skin: Negative for skin complaints  Neurological: Negative for headache All other ROS negative  ____________________________________________   PHYSICAL EXAM:  VITAL SIGNS: ED  Triage Vitals  Enc Vitals Group     BP 07/19/17 1729 (!) 152/100     Pulse Rate 07/19/17 1729 90     Resp 07/19/17 1729 20     Temp 07/19/17 1729 98.6 F (37 C)     Temp Source 07/19/17 1729 Oral     SpO2 07/19/17 1729 100 %     Weight 07/19/17 1732 267 lb (121.1 kg)     Height --      Head Circumference --      Peak Flow --      Pain Score 07/19/17 1732 0     Pain Loc --       Pain Edu? --      Excl. in GC? --     Constitutional: Alert and oriented. Well appearing and in no distress. Eyes: Normal exam ENT   Head: Normocephalic and atraumatic.   Mouth/Throat: Mucous membranes are moist. Cardiovascular: Normal rate, regular rhythm. No murmur Respiratory: Normal respiratory effort without tachypnea nor retractions. Breath sounds are clear  Gastrointestinal: Soft and nontender. No distention.   Musculoskeletal: Nontender with normal range of motion in all extremities. No lower extremity tenderness Neurologic:  Normal speech and language. No gross focal neurologic deficits  Skin:  Skin is warm, dry and intact.  Psychiatric: Mood and affect are normal.   ____________________________________________    EKG EKG reviewed and interpreted by myself shows normal sinus rhythm 89 bpm with a narrow QRS, normal axis, normal intervals, nonspecific but no concerning ST changes.  ____________________________________________   INITIAL IMPRESSION / ASSESSMENT AND PLAN / ED COURSE  Pertinent labs & imaging results that were available during my care of the patient were reviewed by me and considered in my medical decision making (see chart for details).  Patient presents to the emergency department for concerns of hypertension, states her blood pressure was 200 systolic at work today.  Works at a nursing facility.  Differential would include hypertension, uncontrolled hypertension, dizziness.  Patient's blood pressure currently 150/100.  Patient states she just started taking the 25 mg hydrochlorthiazide yesterday.  Discussed with the patient hydrochlorothiazide as a diuretic will take several days to occasionally 1-2 weeks for good effect.  Patient is concerned because she was feeling dizzy today with her blood pressure being elevated states that has resolved somewhat, she also felt chest pressure earlier today.  We will check labs including cardiac enzymes.  Patient's EKG  is reassuring.  Overall the patient appears well, no distress is asking to eat and drink.  Patient's labs including troponin are normal.  EKG is reassuring.  We will discharge the patient home with PCP follow-up.  Patient agreeable to this plan of care.  ____________________________________________   FINAL CLINICAL IMPRESSION(S) / ED DIAGNOSES  Hypertension Chest pressure    Minna AntisPaduchowski, Kayleen Alig, MD 07/19/17 2108

## 2017-07-29 DIAGNOSIS — F418 Other specified anxiety disorders: Secondary | ICD-10-CM | POA: Insufficient documentation

## 2017-07-29 DIAGNOSIS — I10 Essential (primary) hypertension: Secondary | ICD-10-CM | POA: Insufficient documentation

## 2018-02-16 ENCOUNTER — Emergency Department
Admission: EM | Admit: 2018-02-16 | Discharge: 2018-02-16 | Disposition: A | Discharge status: Home / Self Care | Payer: Self-pay | Attending: Emergency Medicine | Admitting: Emergency Medicine

## 2018-02-16 ENCOUNTER — Encounter: Payer: Self-pay | Admitting: Emergency Medicine

## 2018-02-16 ENCOUNTER — Other Ambulatory Visit: Payer: Self-pay

## 2018-02-16 DIAGNOSIS — E119 Type 2 diabetes mellitus without complications: Secondary | ICD-10-CM | POA: Insufficient documentation

## 2018-02-16 DIAGNOSIS — Z79899 Other long term (current) drug therapy: Secondary | ICD-10-CM | POA: Insufficient documentation

## 2018-02-16 DIAGNOSIS — F17200 Nicotine dependence, unspecified, uncomplicated: Secondary | ICD-10-CM | POA: Insufficient documentation

## 2018-02-16 DIAGNOSIS — Z7984 Long term (current) use of oral hypoglycemic drugs: Secondary | ICD-10-CM | POA: Insufficient documentation

## 2018-02-16 DIAGNOSIS — L245 Irritant contact dermatitis due to other chemical products: Secondary | ICD-10-CM | POA: Insufficient documentation

## 2018-02-16 MED ORDER — DIPHENHYDRAMINE HCL 25 MG PO CAPS: 25.0000 mg | ORAL_CAPSULE | Freq: Four times a day (QID) | ORAL | 0 refills | Status: DC | PRN

## 2018-02-16 MED ORDER — FAMOTIDINE 20 MG PO TABS
40.0000 mg | ORAL_TABLET | Freq: Once | ORAL | Status: AC
  Administered 2018-02-16: 40 mg via ORAL
  Filled 2018-02-16: qty 2

## 2018-02-16 MED ORDER — FAMOTIDINE 20 MG PO TABS: 20.0000 mg | ORAL_TABLET | Freq: Two times a day (BID) | ORAL | 0 refills | Status: DC

## 2018-02-16 MED ORDER — PREDNISONE 50 MG PO TABS: ORAL_TABLET | ORAL | 0 refills | Status: DC

## 2018-02-16 MED ORDER — PREDNISONE 20 MG PO TABS
60.0000 mg | ORAL_TABLET | Freq: Once | ORAL | Status: AC
  Administered 2018-02-16: 60 mg via ORAL
  Filled 2018-02-16: qty 3

## 2018-02-16 NOTE — ED Notes (Signed)
Pt has hx of abscesses on her head for which she was treated for MRSA.

## 2018-02-16 NOTE — ED Provider Notes (Signed)
[facility] Emergency Department Provider Note  ____________________________________________  Time seen: Approximately 10:38 PM  I have reviewed the triage vital signs and the nursing notes.   HISTORY  Chief Complaint Rash    HPI Rose SessionsRachael B Tate is a 37 y.o. female presents to the emergency department with pruritic and burning rash to bilateral hands and feet.  Patient denies noticing blister formation.  Patient reports that she first noticed rash this morning when she awoke.  Patient washed her car with a new soap yesterday and is concerned that might have caused rash.  She denies shortness of breath, cough, emesis or diarrhea.  No prior episodes of anaphylaxis.  Patient has not attempted any alleviating measures at home.   Past Medical History:  Diagnosis Date  . Diabetes mellitus without complication (HCC)    pre diabetes    There are no active problems to display for this patient.   Past Surgical History:  Procedure Laterality Date  . CESAREAN SECTION     times 3    Prior to Admission medications   Medication Sig Start Date End Date Taking? Authorizing Provider  buPROPion HCl (WELLBUTRIN PO) Take by mouth.   Yes [provider]  esomeprazole (NEXIUM) 40 MG capsule Take 40 mg by mouth daily at 12 noon.   Yes [provider]  furosemide (LASIX) 20 MG tablet Take 20 mg by mouth daily.   Yes [provider]  losartan-hydrochlorothiazide (HYZAAR) 50-12.5 MG tablet Take 1 tablet by mouth daily.   Yes [provider]  metFORMIN (GLUCOPHAGE) 500 MG tablet Take 500 mg by mouth 2 (two) times daily with a meal.   Yes [provider]  azithromycin (ZITHROMAX) 250 MG tablet Take 1 tablet (250 mg total) by mouth daily. 07/17/17   Irean HongSung, Jade J, MD  cephALEXin (KEFLEX) 500 MG capsule Take 1 capsule (500 mg total) by mouth 4 (four) times daily. 10/09/15   Cuthriell, Delorise RoyalsJonathan D, PA-C  diphenhydrAMINE (BENADRYL ALLERGY)  25 mg capsule Take 1 capsule (25 mg total) by mouth every 6 (six) hours as needed for up to 5 days. 02/16/18 02/21/18  Orvil FeilWoods, Arlow Spiers M, PA-C  famotidine (PEPCID) 20 MG tablet Take 1 tablet (20 mg total) by mouth 2 (two) times daily for 5 days. 02/16/18 02/21/18  Orvil FeilWoods, Anquan Azzarello M, PA-C  hydrochlorothiazide (HYDRODIURIL) 25 MG tablet Take 1 tablet (25 mg total) by mouth daily. 07/17/17   Irean HongSung, Jade J, MD  meclizine (ANTIVERT) 25 MG tablet Take 1 tablet (25 mg total) by mouth 3 (three) times daily as needed for dizziness or nausea. 07/17/17   Irean HongSung, Jade J, MD  mupirocin ointment (BACTROBAN) 2 % Apply three times a day for 5 days. 12/30/15   Renford DillsMiller, Lindsey, NP  ondansetron (ZOFRAN ODT) 4 MG disintegrating tablet Take 1 tablet (4 mg total) by mouth every 8 (eight) hours as needed for nausea or vomiting. 07/17/17   Irean HongSung, Jade J, MD  oxyCODONE-acetaminophen (ROXICET) 5-325 MG tablet Take 1 tablet by mouth every 6 (six) hours as needed for moderate pain. 08/11/16   Joni ReiningSmith, Ronald K, PA-C  predniSONE (DELTASONE) 50 MG tablet Take one 50 mg tablet once daily for the next five days. 02/16/18   Orvil FeilWoods, Ayana Imhof M, PA-C  sulfamethoxazole-trimethoprim (BACTRIM) 400-80 MG tablet Take 1 tablet by mouth 2 (two) times daily. 12/30/15   Renford DillsMiller, Lindsey, NP    Allergies Patient has no known allergies.  No family history on file.  Social History Social History  Tobacco Use  . Smoking status: Current Every Day Smoker  . Smokeless tobacco: Never Used  Substance Use Topics  . Alcohol use: Yes    Comment: occasionally  . Drug use: No     Review of Systems  Constitutional: No fever/chills Eyes: No visual changes. No discharge ENT: No upper respiratory complaints. Cardiovascular: no chest pain. Respiratory: no cough. No SOB. Gastrointestinal: No abdominal pain.  No nausea, no vomiting.  No diarrhea.  No constipation. Genitourinary: Negative for dysuria. No hematuria Musculoskeletal: Negative for musculoskeletal  pain. Skin: Patient has hives of the bilateral hands. Neurological: Negative for headaches, focal weakness or numbness.   ____________________________________________   PHYSICAL EXAM:  VITAL SIGNS: ED Triage Vitals  Enc Vitals Group     BP 02/16/18 2049 129/82     Pulse Rate 02/16/18 2049 86     Resp 02/16/18 2049 18     Temp 02/16/18 2049 98.7 F (37.1 C)     Temp Source 02/16/18 2049 Oral     SpO2 02/16/18 2049 97 %     Weight 02/16/18 2049 276 lb (125.2 kg)     Height 02/16/18 2049 5\' 8"  (1.727 m)     Head Circumference --      Peak Flow --      Pain Score 02/16/18 2052 7     Pain Loc --      Pain Edu? --      Excl. in GC? --      Constitutional: Alert and oriented. Well appearing and in no acute distress. Eyes: Conjunctivae are normal. PERRL. EOMI. Head: Atraumatic. ENT:      Ears: TMs are pearly.      Nose: No congestion/rhinnorhea.      Mouth/Throat: Mucous membranes are moist.  Neck: No stridor.  No cervical spine tenderness to palpation. Cardiovascular: Normal rate, regular rhythm. Normal S1 and S2.  Good peripheral circulation. Respiratory: Normal respiratory effort without tachypnea or retractions. Lungs CTAB. Good air entry to the bases with no decreased or absent breath sounds. Gastrointestinal: Bowel sounds 4 quadrants. Soft and nontender to palpation. No guarding or rigidity. No palpable masses. No distention. No CVA tenderness. Musculoskeletal: Full range of motion to all extremities. No gross deformities appreciated. Neurologic:  Normal speech and language. No gross focal neurologic deficits are appreciated.  Skin: Patient has residual hives of the hands and bilateral forearms.  No hives can be visualized at the feet. Psychiatric: Mood and affect are normal. Speech and behavior are normal. Patient exhibits appropriate insight and judgement.   ____________________________________________   LABS (all labs ordered are listed, but only abnormal results  are displayed)  Labs Reviewed - No data to display ____________________________________________  EKG   ____________________________________________  RADIOLOGY   No results found.  ____________________________________________    PROCEDURES  Procedure(s) performed:    Procedures    Medications  predniSONE (DELTASONE) tablet 60 mg (has no administration in time range)  famotidine (PEPCID) tablet 40 mg (has no administration in time range)     ____________________________________________   INITIAL IMPRESSION / ASSESSMENT AND PLAN / ED COURSE  Pertinent labs & imaging results that were available during my care of the patient were reviewed by me and considered in my medical decision making (see chart for details).  Review of the [place] CSRS was performed in accordance of the [facility] prior to dispensing any controlled drugs.      Assessment and plan Contact dermatitis Patient presents to the emergency department with hives of the  bilateral hands.  Patient was given famotidine and prednisone in the emergency department.  She was discharged with Benadryl, famotidine and prednisone.  She was advised to return to the emergency department for new or worsening symptoms.  Vital signs were reassuring prior to discharge.  All patient questions were answered.  ____________________________________________  FINAL CLINICAL IMPRESSION(S) / ED DIAGNOSES  Final diagnoses:  Irritant contact dermatitis due to other chemical products      NEW MEDICATIONS STARTED DURING THIS VISIT:  ED Discharge Orders         Ordered    predniSONE (DELTASONE) 50 MG tablet     02/16/18 2231    famotidine (PEPCID) 20 MG tablet  2 times daily     02/16/18 2231    diphenhydrAMINE (BENADRYL ALLERGY) 25 mg capsule  Every 6 hours PRN     02/16/18 2231              This chart was dictated using voice recognition software/Dragon. Despite best efforts to proofread, errors can occur which can change  the meaning. Any change was purely unintentional.    Orvil Feil, PA-C 02/16/18 2242    Governor Rooks, MD 02/20/18 1020

## 2018-02-16 NOTE — ED Triage Notes (Signed)
Patient ambulatory to triage with steady gait, without difficulty or distress noted; pt reports since this am has had an itchy burning rash to hands and feet with no known cause

## 2018-06-07 ENCOUNTER — Encounter: Payer: Self-pay | Admitting: Emergency Medicine

## 2018-06-07 ENCOUNTER — Emergency Department: Payer: Medicaid Other

## 2018-06-07 ENCOUNTER — Other Ambulatory Visit: Payer: Self-pay

## 2018-06-07 ENCOUNTER — Emergency Department
Admission: EM | Admit: 2018-06-07 | Discharge: 2018-06-07 | Disposition: A | Discharge status: Home / Self Care | Payer: Medicaid Other | Attending: Emergency Medicine | Admitting: Emergency Medicine

## 2018-06-07 DIAGNOSIS — R1031 Right lower quadrant pain: Secondary | ICD-10-CM | POA: Insufficient documentation

## 2018-06-07 DIAGNOSIS — Z79899 Other long term (current) drug therapy: Secondary | ICD-10-CM | POA: Insufficient documentation

## 2018-06-07 DIAGNOSIS — R05 Cough: Secondary | ICD-10-CM | POA: Diagnosis not present

## 2018-06-07 DIAGNOSIS — E119 Type 2 diabetes mellitus without complications: Secondary | ICD-10-CM | POA: Diagnosis not present

## 2018-06-07 DIAGNOSIS — F17200 Nicotine dependence, unspecified, uncomplicated: Secondary | ICD-10-CM | POA: Diagnosis not present

## 2018-06-07 DIAGNOSIS — R059 Cough, unspecified: Secondary | ICD-10-CM

## 2018-06-07 DIAGNOSIS — R109 Unspecified abdominal pain: Secondary | ICD-10-CM

## 2018-06-07 LAB — COMPREHENSIVE METABOLIC PANEL
ALBUMIN: 3.9 g/dL (ref 3.5–5.0)
ALT: 28 U/L (ref 0–44)
AST: 27 U/L (ref 15–41)
Alkaline Phosphatase: 69 U/L (ref 38–126)
Anion gap: 6 (ref 5–15)
BUN: 10 mg/dL (ref 6–20)
CHLORIDE: 105 mmol/L (ref 98–111)
CO2: 26 mmol/L (ref 22–32)
CREATININE: 0.71 mg/dL (ref 0.44–1.00)
Calcium: 8.5 mg/dL — ABNORMAL LOW (ref 8.9–10.3)
GFR calc non Af Amer: >60 mL/min (ref >60)
Glucose, Bld: 117 mg/dL — ABNORMAL HIGH (ref 70–99)
Potassium: 4 mmol/L (ref 3.5–5.1)
SODIUM: 137 mmol/L (ref 135–145)
Total Bilirubin: 0.6 mg/dL (ref 0.3–1.2)
Total Protein: 7 g/dL (ref 6.5–8.1)

## 2018-06-07 LAB — URINALYSIS, COMPLETE (UACMP) WITH MICROSCOPIC
Bacteria, UA: NONE SEEN
Bilirubin Urine: NEGATIVE
GLUCOSE, UA: NEGATIVE mg/dL
Ketones, ur: NEGATIVE mg/dL
Leukocytes, UA: NEGATIVE
Nitrite: NEGATIVE
PROTEIN: NEGATIVE mg/dL
SPECIFIC GRAVITY, URINE: 1.014 (ref 1.005–1.030)
pH: 8 (ref 5.0–8.0)

## 2018-06-07 LAB — CBC WITH DIFFERENTIAL/PLATELET
Abs Immature Granulocytes: 0.05 10*3/uL (ref 0.00–0.07)
BASOS PCT: 1 %
Basophils Absolute: 0.1 10*3/uL (ref 0.0–0.1)
Eosinophils Absolute: 0.3 10*3/uL (ref 0.0–0.5)
Eosinophils Relative: 3 %
HCT: 36.7 % (ref 36.0–46.0)
Hemoglobin: 11 g/dL — ABNORMAL LOW (ref 12.0–15.0)
IMMATURE GRANULOCYTES: 1 %
Lymphocytes Relative: 28 %
Lymphs Abs: 2.6 10*3/uL (ref 0.7–4.0)
MCH: 22.4 pg — ABNORMAL LOW (ref 26.0–34.0)
MCHC: 30 g/dL (ref 30.0–36.0)
MCV: 74.6 fL — ABNORMAL LOW (ref 80.0–100.0)
Monocytes Absolute: 0.7 10*3/uL (ref 0.1–1.0)
Monocytes Relative: 7 %
NEUTROS ABS: 5.5 10*3/uL (ref 1.7–7.7)
NEUTROS PCT: 60 %
PLATELETS: 406 10*3/uL — AB (ref 150–400)
RBC: 4.92 MIL/uL (ref 3.87–5.11)
RDW: 16.9 % — ABNORMAL HIGH (ref 11.5–15.5)
WBC: 9.2 10*3/uL (ref 4.0–10.5)
nRBC: 0 % (ref 0.0–0.2)

## 2018-06-07 LAB — POCT PREGNANCY, URINE: Preg Test, Ur: NEGATIVE

## 2018-06-07 MED ORDER — CARISOPRODOL 350 MG PO TABS: 350.0000 mg | ORAL_TABLET | Freq: Three times a day (TID) | ORAL | 0 refills | Status: DC | PRN

## 2018-06-07 NOTE — ED Notes (Signed)
Patient transported to CT 

## 2018-06-07 NOTE — ED Triage Notes (Signed)
Patient ambulatory to triage with steady gait, without difficulty or distress noted; pt reports fever, flank pain x wk; denies accomp symptoms; denies hx of same

## 2018-06-07 NOTE — ED Provider Notes (Signed)
[facility] Emergency Department Provider Note  ____________________________________________   First MD Initiated Contact with Patient 06/07/18 2135     (approximate)  I have reviewed the triage vital signs and the nursing notes.   HISTORY  Chief Complaint Flank Pain   HPI Rose Tate is a 38 y.o. female with a history of diabetes who was presented emergency department with right flank pain x1 week with urinary frequency and urgency.  She says the pain is worse with movement.  Says the pain is a 7 out of 10 at this time and sharp.  Says that the pain is to the right back and radiating around to the right abdomen.  Patient also reports feeling subjectively feverish this morning but was unable to take her temperature.   Past Medical History:  Diagnosis Date  . Diabetes mellitus without complication (HCC)    pre diabetes    There are no active problems to display for this patient.   Past Surgical History:  Procedure Laterality Date  . CESAREAN SECTION     times 3    Prior to Admission medications   Medication Sig Start Date End Date Taking? Authorizing Provider  azithromycin (ZITHROMAX) 250 MG tablet Take 1 tablet (250 mg total) by mouth daily. 07/17/17   Irean HongSung, Jade J, MD  buPROPion HCl (WELLBUTRIN PO) Take by mouth.    [provider]  cephALEXin (KEFLEX) 500 MG capsule Take 1 capsule (500 mg total) by mouth 4 (four) times daily. 10/09/15   Cuthriell, Delorise RoyalsJonathan D, PA-C  diphenhydrAMINE (BENADRYL ALLERGY) 25 mg capsule Take 1 capsule (25 mg total) by mouth every 6 (six) hours as needed for up to 5 days. 02/16/18 02/21/18  Orvil FeilWoods, Jaclyn M, PA-C  esomeprazole (NEXIUM) 40 MG capsule Take 40 mg by mouth daily at 12 noon.    [provider]  famotidine (PEPCID) 20 MG tablet Take 1 tablet (20 mg total) by mouth 2 (two) times daily for 5 days. 02/16/18 02/21/18  Orvil FeilWoods, Jaclyn M, PA-C  furosemide (LASIX) 20 MG tablet Take 20 mg by mouth  daily.    [provider]  hydrochlorothiazide (HYDRODIURIL) 25 MG tablet Take 1 tablet (25 mg total) by mouth daily. 07/17/17   Irean HongSung, Jade J, MD  losartan-hydrochlorothiazide (HYZAAR) 50-12.5 MG tablet Take 1 tablet by mouth daily.    [provider]  meclizine (ANTIVERT) 25 MG tablet Take 1 tablet (25 mg total) by mouth 3 (three) times daily as needed for dizziness or nausea. 07/17/17   Irean HongSung, Jade J, MD  metFORMIN (GLUCOPHAGE) 500 MG tablet Take 500 mg by mouth 2 (two) times daily with a meal.    [provider]  mupirocin ointment (BACTROBAN) 2 % Apply three times a day for 5 days. 12/30/15   Renford DillsMiller, Lindsey, NP  ondansetron (ZOFRAN ODT) 4 MG disintegrating tablet Take 1 tablet (4 mg total) by mouth every 8 (eight) hours as needed for nausea or vomiting. 07/17/17   Irean HongSung, Jade J, MD  oxyCODONE-acetaminophen (ROXICET) 5-325 MG tablet Take 1 tablet by mouth every 6 (six) hours as needed for moderate pain. 08/11/16   Joni ReiningSmith, Ronald K, PA-C  predniSONE (DELTASONE) 50 MG tablet Take one 50 mg tablet once daily for the next five days. 02/16/18   Orvil FeilWoods, Jaclyn M, PA-C  sulfamethoxazole-trimethoprim (BACTRIM) 400-80 MG tablet Take 1 tablet by mouth 2 (two) times daily. 12/30/15   Renford DillsMiller, Lindsey, NP    Allergies Patient has no known allergies.  No family  history on file.  Social History Social History   Tobacco Use  . Smoking status: Current Every Day Smoker  . Smokeless tobacco: Never Used  Substance Use Topics  . Alcohol use: Yes    Comment: occasionally  . Drug use: No    Review of Systems  Constitutional: No fever/chills Eyes: No visual changes. ENT: No sore throat. Cardiovascular: Denies chest pain. Respiratory: Denies shortness of breath. Gastrointestinal:   No nausea, no vomiting.  No diarrhea.  No constipation. Genitourinary: As above  musculoskeletal: As above. Skin: Negative for rash. Neurological: Negative for headaches, focal weakness or  numbness.   ____________________________________________   PHYSICAL EXAM:  VITAL SIGNS: ED Triage Vitals [06/07/18 1906]  Enc Vitals Group     BP (!) 161/93     Pulse Rate 97     Resp 20     Temp 98.5 F (36.9 C)     Temp Source Oral     SpO2 99 %     Weight 278 lb (126.1 kg)     Height 5\' 8"  (1.727 m)     Head Circumference      Peak Flow      Pain Score 6     Pain Loc      Pain Edu?      Excl. in GC?     Constitutional: Alert and oriented. Well appearing and in no acute distress. Eyes: Conjunctivae are normal.  Head: Atraumatic. Nose: No congestion/rhinnorhea. Mouth/Throat: Mucous membranes are moist.  Neck: No stridor.   Cardiovascular: Normal rate, regular rhythm. Grossly normal heart sounds.  Good peripheral circulation. Respiratory: Normal respiratory effort.  No retractions. Lungs CTAB. Gastrointestinal: Soft and nontender. No distention.  Mild to moderate right lower CVA tenderness to palpation. Musculoskeletal: Mild edema to the bilateral lower extremities which patient says is chronic.  No saddle anesthesia. Neurologic:  Normal speech and language. No gross focal neurologic deficits are appreciated. Skin:  Skin is warm, dry and intact. No rash noted. Psychiatric: Mood and affect are normal. Speech and behavior are normal.  ____________________________________________   LABS (all labs ordered are listed, but only abnormal results are displayed)  Labs Reviewed  URINALYSIS, COMPLETE (UACMP) WITH MICROSCOPIC - Abnormal; Notable for the following components:      Result Value   Color, Urine YELLOW (*)    APPearance CLEAR (*)    Hgb urine dipstick LARGE (*)    RBC / HPF >50 (*)    All other components within normal limits  CBC WITH DIFFERENTIAL/PLATELET - Abnormal; Notable for the following components:   Hemoglobin 11.0 (*)    MCV 74.6 (*)    MCH 22.4 (*)    RDW 16.9 (*)    Platelets 406 (*)    All other components within normal limits  COMPREHENSIVE  METABOLIC PANEL - Abnormal; Notable for the following components:   Glucose, Bld 117 (*)    Calcium 8.5 (*)    All other components within normal limits  POCT PREGNANCY, URINE   ____________________________________________  EKG   ____________________________________________  RADIOLOGY  CT of the abdomen and pelvis, renal study, without acute process. ____________________________________________   PROCEDURES  Procedure(s) performed:   Procedures  Critical Care performed:   ____________________________________________   INITIAL IMPRESSION / ASSESSMENT AND PLAN / ED COURSE  Pertinent labs & imaging results that were available during my care of the patient were reviewed by me and considered in my medical decision making (see chart for details).  Differential diagnosis includes, but  is not limited to, ovarian cyst, ovarian torsion, acute appendicitis, diverticulitis, urinary tract infection/pyelonephritis, endometriosis, bowel obstruction, colitis, renal colic, gastroenteritis, hernia, fibroids, endometriosis, pregnancy related pain including ectopic pregnancy, etc. As part of my medical decision making, I reviewed the following data within the electronic MEDICAL RECORD NUMBER Notes from prior ED visits  ----------------------------------------- 10:46 PM on 06/07/2018 -----------------------------------------  Patient at this time without acute distress.  We reviewed the CAT scan as well as the urine results.  Reassuring lab results as well.  Says she is concerned because of "kidney disease" in her family.  Also now asking for me to look in her throat.  No tonsillar or uvular swelling, no exudates.  Says that she has had a persistent cough.  Says that she has been to her primary care doctor about the issue of cough and does not have a diagnosis for this.  Says that she was also supposed to see urologist approximate 1 year ago was unable to do so because of insurance issues.  I told  the patient that I do not think that she has an emergent diagnosis at this time requiring further work-up or treatment in the emergency department.  She will be given contact information for outpatient follow-up.  Possible muscle strain.  Will DC home with Soma. ____________________________________________   FINAL CLINICAL IMPRESSION(S) / ED DIAGNOSES  Right flank pain.  Cough.  NEW MEDICATIONS STARTED DURING THIS VISIT:  New Prescriptions   No medications on file     Note:  This document was prepared using Dragon voice recognition software and may include unintentional dictation errors.     Myrna BlazerSchaevitz, Delylah Stanczyk Matthew, MD 06/07/18 [phone]

## 2018-08-08 ENCOUNTER — Emergency Department: Payer: Medicaid Other

## 2018-08-08 ENCOUNTER — Other Ambulatory Visit: Payer: Self-pay

## 2018-08-08 ENCOUNTER — Encounter: Payer: Self-pay | Admitting: Emergency Medicine

## 2018-08-08 ENCOUNTER — Emergency Department
Admission: EM | Admit: 2018-08-08 | Discharge: 2018-08-08 | Disposition: A | Discharge status: Home / Self Care | Payer: Medicaid Other | Attending: Emergency Medicine | Admitting: Emergency Medicine

## 2018-08-08 DIAGNOSIS — R05 Cough: Secondary | ICD-10-CM | POA: Diagnosis present

## 2018-08-08 DIAGNOSIS — F17200 Nicotine dependence, unspecified, uncomplicated: Secondary | ICD-10-CM | POA: Diagnosis not present

## 2018-08-08 DIAGNOSIS — Z79899 Other long term (current) drug therapy: Secondary | ICD-10-CM | POA: Insufficient documentation

## 2018-08-08 DIAGNOSIS — J40 Bronchitis, not specified as acute or chronic: Secondary | ICD-10-CM | POA: Diagnosis not present

## 2018-08-08 DIAGNOSIS — E119 Type 2 diabetes mellitus without complications: Secondary | ICD-10-CM | POA: Insufficient documentation

## 2018-08-08 DIAGNOSIS — R0602 Shortness of breath: Secondary | ICD-10-CM | POA: Insufficient documentation

## 2018-08-08 DIAGNOSIS — Z7984 Long term (current) use of oral hypoglycemic drugs: Secondary | ICD-10-CM | POA: Insufficient documentation

## 2018-08-08 LAB — BASIC METABOLIC PANEL
Anion gap: 11 (ref 5–15)
BUN: 16 mg/dL (ref 6–20)
CHLORIDE: 103 mmol/L (ref 98–111)
CO2: 22 mmol/L (ref 22–32)
CREATININE: 0.87 mg/dL (ref 0.44–1.00)
Calcium: 8.6 mg/dL — ABNORMAL LOW (ref 8.9–10.3)
GFR calc Af Amer: >60 mL/min (ref >60)
GFR calc non Af Amer: >60 mL/min (ref >60)
Glucose, Bld: 209 mg/dL — ABNORMAL HIGH (ref 70–99)
POTASSIUM: 3.8 mmol/L (ref 3.5–5.1)
SODIUM: 136 mmol/L (ref 135–145)

## 2018-08-08 LAB — CBC
HEMATOCRIT: 37.1 % (ref 36.0–46.0)
HEMOGLOBIN: 11 g/dL — AB (ref 12.0–15.0)
MCH: 22 pg — ABNORMAL LOW (ref 26.0–34.0)
MCHC: 29.6 g/dL — AB (ref 30.0–36.0)
MCV: 74.1 fL — ABNORMAL LOW (ref 80.0–100.0)
NRBC: 0 % (ref 0.0–0.2)
Platelets: 388 10*3/uL (ref 150–400)
RBC: 5.01 MIL/uL (ref 3.87–5.11)
RDW: 16.5 % — AB (ref 11.5–15.5)
WBC: 9.3 10*3/uL (ref 4.0–10.5)

## 2018-08-08 LAB — TROPONIN I

## 2018-08-08 MED ORDER — PREDNISONE 50 MG PO TABS: 50.0000 mg | ORAL_TABLET | Freq: Every day | ORAL | 0 refills | Status: DC

## 2018-08-08 MED ORDER — IPRATROPIUM-ALBUTEROL 0.5-2.5 (3) MG/3ML IN SOLN
3.0000 mL | Freq: Once | RESPIRATORY_TRACT | Status: AC
  Administered 2018-08-08: 3 mL via RESPIRATORY_TRACT
  Filled 2018-08-08: qty 3

## 2018-08-08 MED ORDER — ALBUTEROL SULFATE HFA 108 (90 BASE) MCG/ACT IN AERS: 2.0000 | INHALATION_SPRAY | Freq: Four times a day (QID) | RESPIRATORY_TRACT | 2 refills | Status: DC | PRN

## 2018-08-08 NOTE — ED Triage Notes (Signed)
Pt c/o dry cough X 1 month. Pain with cough. Unlabored. VSS. Pt denies fevers. Had flu one month before got better then cough started. No other symptoms other than cough per pt.

## 2018-08-08 NOTE — ED Triage Notes (Signed)
Pt c/o feeling SHOB and like cannot breath when up walking around.  Continues to mention that feels like heavy pressure in chest even when not coughing.  This has been constant over last month.  Feels like cannot catch breath. Unlabored in triage. VSS.

## 2018-08-08 NOTE — ED Provider Notes (Signed)
[facility] Emergency Department Provider Note   ____________________________________________    I have reviewed the triage vital signs and the nursing notes.   HISTORY  Chief Complaint Cough; Shortness of Breath; and Chest Pain     HPI Rose Tate is a 38 y.o. female who presents with complaints of cough, mild shortness of breath and chest tightness over the last 2 to 3 days.  Patient reports she has been coughing for nearly a month but has felt slightly more short of breath recently.  She has been out of her inhalers.  Denies recent travel.  No calf pain or swelling.  No nausea or vomiting or diaphoresis.  Past Medical History:  Diagnosis Date  . Diabetes mellitus without complication (HCC)    pre diabetes    There are no active problems to display for this patient.   Past Surgical History:  Procedure Laterality Date  . CESAREAN SECTION     times 3    Prior to Admission medications   Medication Sig Start Date End Date Taking? Authorizing Provider  albuterol (PROVENTIL HFA;VENTOLIN HFA) 108 (90 Base) MCG/ACT inhaler Inhale 2 puffs into the lungs every 6 (six) hours as needed for wheezing or shortness of breath. 08/08/18   Jene Every, MD  azithromycin (ZITHROMAX) 250 MG tablet Take 1 tablet (250 mg total) by mouth daily. 07/17/17   Irean Hong, MD  buPROPion HCl (WELLBUTRIN PO) Take by mouth.    [provider]  carisoprodol (SOMA) 350 MG tablet Take 1 tablet (350 mg total) by mouth 3 (three) times daily as needed. 06/07/18   Schaevitz, Myra Rude, MD  cephALEXin (KEFLEX) 500 MG capsule Take 1 capsule (500 mg total) by mouth 4 (four) times daily. 10/09/15   Cuthriell, Delorise Royals, PA-C  diphenhydrAMINE (BENADRYL ALLERGY) 25 mg capsule Take 1 capsule (25 mg total) by mouth every 6 (six) hours as needed for up to 5 days. 02/16/18 02/21/18  Orvil Feil, PA-C  esomeprazole (NEXIUM) 40 MG capsule Take 40 mg by mouth daily at 12  noon.    [provider]  famotidine (PEPCID) 20 MG tablet Take 1 tablet (20 mg total) by mouth 2 (two) times daily for 5 days. 02/16/18 02/21/18  Orvil Feil, PA-C  furosemide (LASIX) 20 MG tablet Take 20 mg by mouth daily.    [provider]  hydrochlorothiazide (HYDRODIURIL) 25 MG tablet Take 1 tablet (25 mg total) by mouth daily. 07/17/17   Irean Hong, MD  losartan-hydrochlorothiazide (HYZAAR) 50-12.5 MG tablet Take 1 tablet by mouth daily.    [provider]  meclizine (ANTIVERT) 25 MG tablet Take 1 tablet (25 mg total) by mouth 3 (three) times daily as needed for dizziness or nausea. 07/17/17   Irean Hong, MD  metFORMIN (GLUCOPHAGE) 500 MG tablet Take 500 mg by mouth 2 (two) times daily with a meal.    [provider]  mupirocin ointment (BACTROBAN) 2 % Apply three times a day for 5 days. 12/30/15   Renford Dills, NP  ondansetron (ZOFRAN ODT) 4 MG disintegrating tablet Take 1 tablet (4 mg total) by mouth every 8 (eight) hours as needed for nausea or vomiting. 07/17/17   Irean Hong, MD  oxyCODONE-acetaminophen (ROXICET) 5-325 MG tablet Take 1 tablet by mouth every 6 (six) hours as needed for moderate pain. 08/11/16   Joni Reining, PA-C  predniSONE (DELTASONE) 50 MG tablet Take 1 tablet (50 mg total) by mouth daily  with breakfast. 08/08/18   Jene Every, MD  sulfamethoxazole-trimethoprim (BACTRIM) 400-80 MG tablet Take 1 tablet by mouth 2 (two) times daily. 12/30/15   Renford Dills, NP     Allergies Patient has no known allergies.  History reviewed. No pertinent family history.  Social History Social History   Tobacco Use  . Smoking status: Current Every Day Smoker  . Smokeless tobacco: Never Used  Substance Use Topics  . Alcohol use: Yes    Comment: occasionally  . Drug use: No    Review of Systems  Constitutional: No fever/chills Eyes: No visual changes.  ENT: No sore throat. Cardiovascular: Chest tightness Respiratory: As  above Gastrointestinal: No abdominal pain.  No nausea, no vomiting.   Genitourinary: Negative for dysuria. Musculoskeletal: Negative for back pain. Skin: Negative for rash. Neurological: Negative for headaches   ____________________________________________   PHYSICAL EXAM:  VITAL SIGNS: ED Triage Vitals  Enc Vitals Group     BP 08/08/18 1832 (!) 148/99     Pulse Rate 08/08/18 1832 97     Resp 08/08/18 1832 20     Temp 08/08/18 1832 99.3 F (37.4 C)     Temp Source 08/08/18 1832 Oral     SpO2 08/08/18 1832 98 %     Weight 08/08/18 1833 131.5 kg (290 lb)     Height 08/08/18 1833 1.727 m (5\' 8" )     Head Circumference --      Peak Flow --      Pain Score 08/08/18 1833 6     Pain Loc --      Pain Edu? --      Excl. in GC? --     Constitutional: Alert and oriented. No acute distress. Pleasant and interactive  Nose: No congestion/rhinnorhea. Mouth/Throat: Mucous membranes are moist.   Cardiovascular: Normal rate, regular rhythm. Grossly normal heart sounds.  Good peripheral circulation. Respiratory: Normal respiratory effort.  No retractions.  Scattered mild wheezes Gastrointestinal: Soft and nontender. No distention.  No CVA tenderness.  Musculoskeletal: Warm and well perfused Neurologic:  Normal speech and language. No gross focal neurologic deficits are appreciated.  Skin:  Skin is warm, dry and intact. No rash noted. Psychiatric: Mood and affect are normal. Speech and behavior are normal.  ____________________________________________   LABS (all labs ordered are listed, but only abnormal results are displayed)  Labs Reviewed  BASIC METABOLIC PANEL - Abnormal; Notable for the following components:      Result Value   Glucose, Bld 209 (*)    Calcium 8.6 (*)    All other components within normal limits  CBC - Abnormal; Notable for the following components:   Hemoglobin 11.0 (*)    MCV 74.1 (*)    MCH 22.0 (*)    MCHC 29.6 (*)    RDW 16.5 (*)    All other  components within normal limits  TROPONIN I  POC URINE PREG, ED   ____________________________________________  EKG  ED ECG REPORT I, Jene Every, the attending physician, personally viewed and interpreted this ECG.  Date: 08/08/2018  Rhythm: normal sinus rhythm QRS Axis: normal Intervals: normal ST/T Wave abnormalities: normal Narrative Interpretation: no evidence of acute ischemia  ____________________________________________  RADIOLOGY  Chest x-ray unremarkable ____________________________________________   PROCEDURES  Procedure(s) performed: No  Procedures   Critical Care performed: No ____________________________________________   INITIAL IMPRESSION / ASSESSMENT AND PLAN / ED COURSE  Pertinent labs & imaging results that were available during my care of the patient were reviewed by me  and considered in my medical decision making (see chart for details).  Patient presents with several weeks of cough, mild shortness of breath, wheezing on exam.  Suspect bronchitis with mild bronchospasm.  No evidence of pneumonia on chest x-ray.  Treated with DuoNeb with resolution of wheezing.  Will start on several days of prednisone, albuterol inhaler refilled.  Outpatient follow-up with PCP    ____________________________________________   FINAL CLINICAL IMPRESSION(S) / ED DIAGNOSES  Final diagnoses:  Bronchitis        Note:  This document was prepared using Dragon voice recognition software and may include unintentional dictation errors.   Jene EveryKinner, Subrina Vecchiarelli, MD 08/08/18 2050

## 2018-09-12 ENCOUNTER — Other Ambulatory Visit: Payer: Self-pay

## 2018-09-12 ENCOUNTER — Emergency Department
Admission: EM | Admit: 2018-09-12 | Discharge: 2018-09-12 | Disposition: A | Discharge status: Home / Self Care | Payer: Medicaid Other | Attending: Emergency Medicine | Admitting: Emergency Medicine

## 2018-09-12 ENCOUNTER — Encounter: Payer: Self-pay | Admitting: *Deleted

## 2018-09-12 DIAGNOSIS — E119 Type 2 diabetes mellitus without complications: Secondary | ICD-10-CM | POA: Insufficient documentation

## 2018-09-12 DIAGNOSIS — Z79899 Other long term (current) drug therapy: Secondary | ICD-10-CM | POA: Insufficient documentation

## 2018-09-12 DIAGNOSIS — K047 Periapical abscess without sinus: Secondary | ICD-10-CM | POA: Diagnosis not present

## 2018-09-12 DIAGNOSIS — Z7984 Long term (current) use of oral hypoglycemic drugs: Secondary | ICD-10-CM | POA: Diagnosis not present

## 2018-09-12 DIAGNOSIS — K0889 Other specified disorders of teeth and supporting structures: Secondary | ICD-10-CM | POA: Diagnosis present

## 2018-09-12 DIAGNOSIS — F17210 Nicotine dependence, cigarettes, uncomplicated: Secondary | ICD-10-CM | POA: Diagnosis not present

## 2018-09-12 MED ORDER — CLINDAMYCIN HCL 300 MG PO CAPS: 300.0000 mg | ORAL_CAPSULE | Freq: Three times a day (TID) | ORAL | 0 refills | Status: AC

## 2018-09-12 MED ORDER — OXYCODONE-ACETAMINOPHEN 5-325 MG PO TABS
1.0000 | ORAL_TABLET | Freq: Once | ORAL | Status: AC
Start: 2018-09-12 — End: 2018-09-12
  Administered 2018-09-12: 1 via ORAL
  Filled 2018-09-12: qty 1

## 2018-09-12 MED ORDER — CLINDAMYCIN PHOSPHATE 600 MG/4ML IJ SOLN
600.0000 mg | Freq: Once | INTRAMUSCULAR | Status: AC
  Administered 2018-09-12: 20:00:00 600 mg via INTRAMUSCULAR
  Filled 2018-09-12: qty 4

## 2018-09-12 NOTE — ED Provider Notes (Signed)
[facility] Emergency Department Provider Note  ____________________________________________  Time seen: Approximately 8:25 PM  I have reviewed the triage vital signs and the nursing notes.   HISTORY  Chief Complaint Dental Pain    HPI Rose Tate is a 38 y.o. female presents to the emergency department with swelling of the right upper jaw for the past 5 to 6 days.  Patient reports that she has been unable to seek care with a local dentist as she has been laid off from [employer] due to COVID-19.  Patient states that she addressed her concerns with primary care who prescribed her Augmentin.  Patient is on day 2 of Augmentin.  She denies fever or chills at home.  She denies pain underneath the tongue or neck swelling.  She has been able to speak in complete sentences without difficulty.  Patient reports that she feels like Augmentin is not helping her.  Dental pain is affecting her ability to eat.  Patient is frustrated that her primary care did not prescribe her pain medication.   Past Medical History:  Diagnosis Date  . Diabetes mellitus without complication (HCC)    pre diabetes    There are no active problems to display for this patient.   Past Surgical History:  Procedure Laterality Date  . CESAREAN SECTION     times 3    Prior to Admission medications   Medication Sig Start Date End Date Taking? Authorizing Provider  albuterol (PROVENTIL HFA;VENTOLIN HFA) 108 (90 Base) MCG/ACT inhaler Inhale 2 puffs into the lungs every 6 (six) hours as needed for wheezing or shortness of breath. 08/08/18   Jene Every, MD  azithromycin (ZITHROMAX) 250 MG tablet Take 1 tablet (250 mg total) by mouth daily. 07/17/17   Irean Hong, MD  buPROPion HCl (WELLBUTRIN PO) Take by mouth.    [provider]  carisoprodol (SOMA) 350 MG tablet Take 1 tablet (350 mg total) by mouth 3 (three) times daily as needed. 06/07/18   Schaevitz, Myra Rude, MD  cephALEXin  (KEFLEX) 500 MG capsule Take 1 capsule (500 mg total) by mouth 4 (four) times daily. 10/09/15   Cuthriell, Delorise Royals, PA-C  clindamycin (CLEOCIN) 300 MG capsule Take 1 capsule (300 mg total) by mouth 3 (three) times daily for 10 days. 09/12/18 09/22/18  Orvil Feil, PA-C  diphenhydrAMINE (BENADRYL ALLERGY) 25 mg capsule Take 1 capsule (25 mg total) by mouth every 6 (six) hours as needed for up to 5 days. 02/16/18 02/21/18  Orvil Feil, PA-C  esomeprazole (NEXIUM) 40 MG capsule Take 40 mg by mouth daily at 12 noon.    [provider]  famotidine (PEPCID) 20 MG tablet Take 1 tablet (20 mg total) by mouth 2 (two) times daily for 5 days. 02/16/18 02/21/18  Orvil Feil, PA-C  furosemide (LASIX) 20 MG tablet Take 20 mg by mouth daily.    [provider]  hydrochlorothiazide (HYDRODIURIL) 25 MG tablet Take 1 tablet (25 mg total) by mouth daily. 07/17/17   Irean Hong, MD  losartan-hydrochlorothiazide (HYZAAR) 50-12.5 MG tablet Take 1 tablet by mouth daily.    [provider]  meclizine (ANTIVERT) 25 MG tablet Take 1 tablet (25 mg total) by mouth 3 (three) times daily as needed for dizziness or nausea. 07/17/17   Irean Hong, MD  metFORMIN (GLUCOPHAGE) 500 MG tablet Take 500 mg by mouth 2 (two) times daily with a meal.    [provider]  mupirocin ointment (  BACTROBAN) 2 % Apply three times a day for 5 days. 12/30/15   Renford Dills, NP  ondansetron (ZOFRAN ODT) 4 MG disintegrating tablet Take 1 tablet (4 mg total) by mouth every 8 (eight) hours as needed for nausea or vomiting. 07/17/17   Irean Hong, MD  oxyCODONE-acetaminophen (ROXICET) 5-325 MG tablet Take 1 tablet by mouth every 6 (six) hours as needed for moderate pain. 08/11/16   Joni Reining, PA-C  predniSONE (DELTASONE) 50 MG tablet Take 1 tablet (50 mg total) by mouth daily with breakfast. 08/08/18   Jene Every, MD  sulfamethoxazole-trimethoprim (BACTRIM) 400-80 MG tablet Take 1 tablet by mouth 2 (two)  times daily. 12/30/15   Renford Dills, NP    Allergies Patient has no known allergies.  No family history on file.  Social History Social History   Tobacco Use  . Smoking status: Current Every Day Smoker  . Smokeless tobacco: Never Used  Substance Use Topics  . Alcohol use: Not Currently    Comment: occasionally  . Drug use: No     Review of Systems  Constitutional: No fever/chills Eyes: No visual changes. No discharge ENT: Patient has dental pain and swelling of right upper jaw.  Cardiovascular: no chest pain. Respiratory: no cough. No SOB. Gastrointestinal: No abdominal pain.  No nausea, no vomiting.  No diarrhea.  No constipation. Genitourinary: Negative for dysuria. No hematuria Musculoskeletal: Negative for musculoskeletal pain. Skin: Negative for rash, abrasions, lacerations, ecchymosis. Neurological: Negative for headaches, focal weakness or numbness.   ____________________________________________   PHYSICAL EXAM:  VITAL SIGNS: ED Triage Vitals  Enc Vitals Group     BP 09/12/18 1907 (!) 176/123     Pulse Rate 09/12/18 1907 100     Resp 09/12/18 1907 18     Temp 09/12/18 1907 98.5 F (36.9 C)     Temp Source 09/12/18 1907 Oral     SpO2 09/12/18 1907 99 %     Weight 09/12/18 1908 290 lb (131.5 kg)     Height 09/12/18 1908 5\' 8"  (1.727 m)     Head Circumference --      Peak Flow --      Pain Score 09/12/18 1906 8     Pain Loc --      Pain Edu? --      Excl. in GC? --      Constitutional: Alert and oriented. Well appearing and in no acute distress. Eyes: Conjunctivae are normal. PERRL. EOMI. Head: Atraumatic. ENT:      Ears: TMs are pearly.       Nose: No congestion/rhinnorhea.      Mouth/Throat: Mucous membranes are moist.  Patient has swelling of right upper jaw.  Patient has dental caries of superior 3.  Dentition is otherwise healthy Neck: No stridor.  No cervical spine tenderness to palpation. Cardiovascular: Normal rate, regular rhythm.  Normal S1 and S2.  Good peripheral circulation. Respiratory: Normal respiratory effort without tachypnea or retractions. Lungs CTAB. Good air entry to the bases with no decreased or absent breath sounds. Musculoskeletal: Full range of motion to all extremities. No gross deformities appreciated. Neurologic:  Normal speech and language. No gross focal neurologic deficits are appreciated.  Skin:  Skin is warm, dry and intact. No rash noted. Psychiatric: Mood and affect are normal. Speech and behavior are normal. Patient exhibits appropriate insight and judgement.   ____________________________________________   LABS (all labs ordered are listed, but only abnormal results are displayed)  Labs Reviewed - No data  to display ____________________________________________  EKG   ____________________________________________  RADIOLOGY   No results found.  ____________________________________________    PROCEDURES  Procedure(s) performed:    Procedures    Medications  clindamycin (CLEOCIN) injection 600 mg (600 mg Intramuscular Given 09/12/18 2026)  oxyCODONE-acetaminophen (PERCOCET/ROXICET) 5-325 MG per tablet 1 tablet (1 tablet Oral Given 09/12/18 2026)     ____________________________________________   INITIAL IMPRESSION / ASSESSMENT AND PLAN / ED COURSE  Pertinent labs & imaging results that were available during my care of the patient were reviewed by me and considered in my medical decision making (see chart for details).  Review of the [place] CSRS was performed in accordance of the [facility] prior to dispensing any controlled drugs.      Assessment and plan Dental abscess Patient presents to the emergency department with swelling of the right upper jaw and dental pain surrounding superior 3.  Patient was given injection of clindamycin in the emergency department.  She was discharged with clindamycin.  She was given a one-time dose of Roxicet in the emergency department.   Patient was advised that if she returns to the emergency department requesting other narcotic medications, request would not be granted. Patient was advised to continue using tylenol and ibuprofen alternating for pain. All patient questions were answered.     ____________________________________________  FINAL CLINICAL IMPRESSION(S) / ED DIAGNOSES  Final diagnoses:  Dental abscess      NEW MEDICATIONS STARTED DURING THIS VISIT:  ED Discharge Orders         Ordered    clindamycin (CLEOCIN) 300 MG capsule  3 times daily     09/12/18 2050              This chart was dictated using voice recognition software/Dragon. Despite best efforts to proofread, errors can occur which can change the meaning. Any change was purely unintentional.    Gasper LloydWoods, Jaclyn M, PA-C 09/12/18 [id number]    Sharman CheekStafford, Phillip, MD 09/12/18 [phone]

## 2018-09-12 NOTE — ED Triage Notes (Signed)
FIRST NURSE NOTE-pt c/o pain to teeth and mild swelling. Unlabored. NAD.

## 2018-09-12 NOTE — ED Triage Notes (Signed)
Pt has right upper toothache with swelling to right side of face.  Sx for 3 days   Taking motrin without relief  Pt alert.

## 2018-09-12 NOTE — Discharge Instructions (Signed)
OPTIONS FOR DENTAL FOLLOW UP CARE ° °[place] Department of Health and Human Services - Local Safety Net Dental Clinics °http://www.ncdhhs.gov/dph/oralhealth/services/safetynetclinics.htm °  °[facility] (336-562-3123) ° °[facility] [place] ([phone]) ° °[facility] ([phone] ext [phone]) ° °[facility] ([phone]) ° °[facility] ([phone]) °This clinic caters to the indigent population and is on a lottery system. °Location: °[facility], [address], [place] °Clinic Hours: °Wednesdays from 6pm - 9pm, patients seen by a lottery system. °For dates, call or go to www.med.[facility].edu/[facility]/patients/Dental-[facility] °Services: °Cleanings, fillings and simple extractions. °Payment Options: °DENTAL WORK IS FREE OF CHARGE. Bring proof of income or support. °Best way to get seen: °Arrive at 5:15 pm - this is a lottery, NOT first come/first serve, so arriving earlier will not increase your chances of being seen. °  °  °[facility] °[phone] °Select option 1 for emergencies °  °Location: °[facility], [address], [place] °Clinic Hours: °No walk-ins accepted - call the day before to schedule an appointment. °Check in times are 9:30 am and 1:30 pm. °Services: °Simple extractions, temporary fillings, pulpectomy/pulp debridement, uncomplicated abscess drainage. °Payment Options: °PAYMENT IS DUE AT THE TIME OF SERVICE.  Fee is usually $100-200, additional surgical procedures (e.g. abscess drainage) may be extra. °Cash, checks, Visa/MasterCard accepted.  Can file Medicaid if patient is covered for dental - patient should call case worker to check. °No discount for [facility] patients. °Best way to get seen: °MUST call the day before and get onto the schedule. Can usually be seen the next 1-2 days. No walk-ins accepted. °  °  °[facility] °[phone] °   °Location: °[facility], [address], [place] °Clinic Hours: °M, W, Th, F 8am or 1:30pm, Tues 9a or 1:30 - first come/first served. °Services: °Simple extractions, temporary fillings, uncomplicated abscess drainage.  You do not need to be an [place] resident. °Payment Options: °PAYMENT IS DUE AT THE TIME OF SERVICE. °Dental insurance, otherwise sliding scale - bring proof of income or support. °Depending on income and treatment needed, cost is usually $50-200. °Best way to get seen: °Arrive early as it is first come/first served. °  °  °[facility] °[phone] °  °Location: °[address] °Clinic Hours: °Mon-Thu 8a-5p °Services: °Most basic dental services including extractions and fillings. °Payment Options: °PAYMENT IS DUE AT THE TIME OF SERVICE. °Sliding scale, up to 50% off - bring proof if income or support. °Medicaid with dental option accepted. °Best way to get seen: °Call to schedule an appointment, can usually be seen within 2 weeks OR they will try to see walk-ins - show up at 8a or 2p (you may have to wait). °  °  °[facility] °919-245-2435 °[place] RESIDENTS ONLY °  °Location: °[facility], [address], [place], [place] [address] °Clinic Hours: By appointment only. °Monday - Thursday 8am-5pm, Friday 8am-12pm °Services: Cleanings, fillings, extractions. °Payment Options: °PAYMENT IS DUE AT THE TIME OF SERVICE. °Cash, Visa or MasterCard. Sliding scale - $30 minimum per service. °Best way to get seen: °Come in to office, complete packet and make an appointment - need proof of income °or support monies for each household member and proof of [place] residence. °Usually takes about a month to get in. °  °  °[facility] °[phone] °  °Location: °[address]   [place] °Clinic Hours: Walk-in Urgent Care Dental Services are offered Monday-Friday  mornings only. °The numbers of emergencies accepted daily is limited to the number of °providers available. °Maximum 15 - Mondays, Wednesdays & Thursdays °Maximum 10 - Tuesdays & Fridays °Services: °You do not need to be a [place] resident to be seen for a dental emergency. °Emergencies are defined as pain, swelling, abnormal bleeding, or dental trauma. Walkins will receive x-rays if needed. °NOTE: Dental cleaning is not an emergency. °Payment Options: °PAYMENT IS DUE AT THE TIME OF SERVICE. °Minimum co-pay is $40.00 for uninsured patients. °Minimum co-pay is $3.00 for Medicaid with dental coverage. °Dental Insurance is accepted and must be presented at time of visit. °Medicare does not cover dental. °Forms of payment: Cash, credit card, checks. °Best way to get seen: °If not previously registered with the clinic, walk-in dental registration begins at 7:15 am and is on a first come/first serve basis. °If previously registered with the clinic, call to make an appointment. °  °  °[facility] °[phone] °[place] RESIDENTS ONLY °  °Location: °[address], [place], [place] °Clinic Hours: °Mon-Thu 10a-2p °Services: Extractions only! °Payment Options: °FREE (donations accepted) - bring proof of income or support °Best way to get seen: °Call and schedule an appointment OR come at 8am on the 1st Monday of every month (except for holidays) when it is first come/first served. °  °  °[facility] °[phone] °  °Location: °[address], [place] °Clinic Hours: °Friday mornings °Services, Payment Options, Best way to get seen: °Call for info °

## 2019-09-03 ENCOUNTER — Ambulatory Visit: Payer: Medicaid Other | Attending: Internal Medicine

## 2019-09-03 DIAGNOSIS — Z23 Encounter for immunization: Secondary | ICD-10-CM

## 2019-09-03 NOTE — Progress Notes (Signed)
   Covid-19 Vaccination Clinic  Name:  KEIRAH KONITZER    MRN: [id number] DOB: 12-04-1980  09/03/2019  Ms. Kearn was observed post Covid-19 immunization for 15 minutes without incident. She was provided with Vaccine Information Sheet and instruction to access the V-Safe system.   Ms. Grisanti was instructed to call 911 with any severe reactions post vaccine: Marland Kitchen Difficulty breathing  . Swelling of face and throat  . A fast heartbeat  . A bad rash all over body  . Dizziness and weakness   Immunizations Administered    Name Date Dose VIS Date Route   Pfizer COVID-19 Vaccine 09/03/2019 10:09 AM 0.3 mL 05/11/2019 Intramuscular   Manufacturer: [employer], [employer]   Lot: [id number]   NDC: [address]-5

## 2019-09-28 ENCOUNTER — Ambulatory Visit: Payer: Medicaid Other | Attending: Internal Medicine

## 2019-11-19 ENCOUNTER — Other Ambulatory Visit: Payer: Self-pay

## 2019-11-19 ENCOUNTER — Ambulatory Visit
Admission: EM | Admit: 2019-11-19 | Discharge: 2019-11-19 | Disposition: A | Discharge status: Home / Self Care | Payer: Medicaid Other | Attending: Emergency Medicine | Admitting: Emergency Medicine

## 2019-11-19 DIAGNOSIS — Z7984 Long term (current) use of oral hypoglycemic drugs: Secondary | ICD-10-CM | POA: Insufficient documentation

## 2019-11-19 DIAGNOSIS — J069 Acute upper respiratory infection, unspecified: Secondary | ICD-10-CM | POA: Insufficient documentation

## 2019-11-19 DIAGNOSIS — F17210 Nicotine dependence, cigarettes, uncomplicated: Secondary | ICD-10-CM | POA: Insufficient documentation

## 2019-11-19 DIAGNOSIS — I10 Essential (primary) hypertension: Secondary | ICD-10-CM | POA: Insufficient documentation

## 2019-11-19 DIAGNOSIS — J019 Acute sinusitis, unspecified: Secondary | ICD-10-CM | POA: Insufficient documentation

## 2019-11-19 DIAGNOSIS — L509 Urticaria, unspecified: Secondary | ICD-10-CM | POA: Diagnosis not present

## 2019-11-19 DIAGNOSIS — E119 Type 2 diabetes mellitus without complications: Secondary | ICD-10-CM | POA: Diagnosis not present

## 2019-11-19 DIAGNOSIS — Z20822 Contact with and (suspected) exposure to covid-19: Secondary | ICD-10-CM | POA: Insufficient documentation

## 2019-11-19 DIAGNOSIS — Z79899 Other long term (current) drug therapy: Secondary | ICD-10-CM | POA: Insufficient documentation

## 2019-11-19 DIAGNOSIS — Z7952 Long term (current) use of systemic steroids: Secondary | ICD-10-CM | POA: Diagnosis not present

## 2019-11-19 HISTORY — DX: Essential (primary) hypertension: I10

## 2019-11-19 MED ORDER — ALBUTEROL SULFATE HFA 108 (90 BASE) MCG/ACT IN AERS: 1.0000 | INHALATION_SPRAY | Freq: Four times a day (QID) | RESPIRATORY_TRACT | 0 refills | Status: AC | PRN

## 2019-11-19 MED ORDER — AZITHROMYCIN 250 MG PO TABS
ORAL_TABLET | ORAL | 0 refills | Status: DC
Start: 2019-11-19 — End: 2021-02-26

## 2019-11-19 NOTE — ED Triage Notes (Signed)
Pt states URI sx for past 2.5 weeks. No COVID in the house. Pt had COVID test 2 weeks ago at [facility] and was negative. Pt can't seem to kick the cough and has a headache.

## 2019-11-19 NOTE — Discharge Instructions (Signed)
-  Azithromycin:Take 2 tablets by mouth the first day followed by one tablet daily for next 4 days. -Albuterol: 1-2 puffs every 6 hours as needed for cough or wheezing -Continue the Benadryl as needed for the hives.  Continue looking for any causes.  Heat rash could also be a cause.  If fever over-the-counter medications have been started recently, those could be a possible cause as well  -Push fluids -Rest -Follow-up with primary care provider as needed.

## 2019-11-19 NOTE — ED Provider Notes (Signed)
[facility]    CSN: [id number] Arrival date & time: 11/19/19  1853      History   Chief Complaint Chief Complaint  Patient presents with  . Cough  . Headache    HPI Rose Tate is a 39 y.o. female.   Patient a 39 year old female who presents with complaint of upper respiratory symptoms about 2-1/2 weeks.  States she has been taking over-the-counter medications but things were not getting better now reports worsening sinus symptoms and cough.  She states she has been taking Synex severe as well as generic guaifenesin.  Patient also reports waking up with hives to her legs, buttocks, and distal arms each morning since Friday morning.  States they tend to improve with Benadryl.  Patient denies any new laundry detergent, fabric softener, soaps, shampoos, etc. she does report multiple animals in the house so fleas could be a possibility.  Denies noticing any other bugs or anything in the bed or any other causes that she can think of.     Past Medical History:  Diagnosis Date  . Diabetes mellitus without complication (HCC)    pre diabetes  . Hypertension     There are no problems to display for this patient.   Past Surgical History:  Procedure Laterality Date  . CESAREAN SECTION     times 3    OB History   No obstetric history on file.      Home Medications    Prior to Admission medications   Medication Sig Start Date End Date Taking? Authorizing Provider  hydrochlorothiazide (HYDRODIURIL) 25 MG tablet Take 25 mg by mouth daily.   Yes [provider]  losartan (COZAAR) 25 MG tablet Take 100 mg by mouth daily.   Yes [provider]  albuterol (VENTOLIN HFA) 108 (90 Base) MCG/ACT inhaler Inhale 1-2 puffs into the lungs every 6 (six) hours as needed for wheezing or shortness of breath. 11/19/19   Candis Schatz, PA-C  azithromycin (ZITHROMAX Z-PAK) 250 MG tablet Take 2 tablets by mouth the first day followed by one tablet daily for  next 4 days. 11/19/19   Candis Schatz, PA-C  buPROPion HCl (WELLBUTRIN PO) Take by mouth.    [provider]  carisoprodol (SOMA) 350 MG tablet Take 1 tablet (350 mg total) by mouth 3 (three) times daily as needed. 06/07/18   Schaevitz, Myra Rude, MD  cephALEXin (KEFLEX) 500 MG capsule Take 1 capsule (500 mg total) by mouth 4 (four) times daily. 10/09/15   Cuthriell, Delorise Royals, PA-C  diphenhydrAMINE (BENADRYL ALLERGY) 25 mg capsule Take 1 capsule (25 mg total) by mouth every 6 (six) hours as needed for up to 5 days. 02/16/18 02/21/18  Orvil Feil, PA-C  empagliflozin (JARDIANCE) 10 MG TABS tablet Jardiance 10 mg tablet    [provider]  esomeprazole (NEXIUM) 40 MG capsule Take 40 mg by mouth daily at 12 noon.    [provider]  famotidine (PEPCID) 20 MG tablet Take 1 tablet (20 mg total) by mouth 2 (two) times daily for 5 days. 02/16/18 02/21/18  Orvil Feil, PA-C  furosemide (LASIX) 20 MG tablet Take 20 mg by mouth daily.    [provider]  glipiZIDE (GLUCOTROL) 10 MG tablet glipizide 10 mg tablet    [provider]  hydrochlorothiazide (HYDRODIURIL) 25 MG tablet Take 1 tablet (25 mg total) by mouth daily. 07/17/17   Irean Hong, MD  meclizine (ANTIVERT) 25 MG tablet Take 1  tablet (25 mg total) by mouth 3 (three) times daily as needed for dizziness or nausea. 07/17/17   Irean Hong, MD  metFORMIN (GLUCOPHAGE) 500 MG tablet Take 500 mg by mouth 2 (two) times daily with a meal.    [provider]  mupirocin ointment (BACTROBAN) 2 % Apply three times a day for 5 days. 12/30/15   Renford Dills, NP  ondansetron (ZOFRAN ODT) 4 MG disintegrating tablet Take 1 tablet (4 mg total) by mouth every 8 (eight) hours as needed for nausea or vomiting. 07/17/17   Irean Hong, MD  oxyCODONE-acetaminophen (ROXICET) 5-325 MG tablet Take 1 tablet by mouth every 6 (six) hours as needed for moderate pain. 08/11/16   Joni Reining, PA-C  predniSONE  (DELTASONE) 50 MG tablet Take 1 tablet (50 mg total) by mouth daily with breakfast. 08/08/18   Jene Every, MD  sitaGLIPtin (JANUVIA) 100 MG tablet Januvia 100 mg tablet    [provider]  sulfamethoxazole-trimethoprim (BACTRIM) 400-80 MG tablet Take 1 tablet by mouth 2 (two) times daily. 12/30/15   Renford Dills, NP  losartan-hydrochlorothiazide (HYZAAR) 50-12.5 MG tablet Take 1 tablet by mouth daily.  11/19/19  [provider]    Family History History reviewed. No pertinent family history.  Social History Social History   Tobacco Use  . Smoking status: Current Every Day Smoker    Packs/day: 0.50    Types: Cigarettes  . Smokeless tobacco: Never Used  Substance Use Topics  . Alcohol use: Yes    Comment: occasionally  . Drug use: No     Allergies   Patient has no known allergies.   Review of Systems Review of Systems as noted above HPI.  Other systems reviewed and found to be negative.   Physical Exam Triage Vital Signs ED Triage Vitals  Enc Vitals Group     BP 11/19/19 1911 119/88     Pulse Rate 11/19/19 1911 (!) 108     Resp 11/19/19 1911 20     Temp 11/19/19 1911 98.8 F (37.1 C)     Temp src --      SpO2 11/19/19 1911 98 %     Weight 11/19/19 1905 280 lb (127 kg)     Height 11/19/19 1905 5' 7.5" (1.715 m)     Head Circumference --      Peak Flow --      Pain Score 11/19/19 1905 6     Pain Loc --      Pain Edu? --      Excl. in GC? --    No data found.  Updated Vital Signs BP 119/88 (BP Location: Right Arm)   Pulse (!) 108   Temp 98.8 F (37.1 C)   Resp 20   Ht 5' 7.5" (1.715 m)   Wt 280 lb (127 kg)   LMP 11/03/2019   SpO2 98%   BMI 43.21 kg/m    Physical Exam Constitutional:      Appearance: Normal appearance. She is well-developed. She is ill-appearing. She is not toxic-appearing.  HENT:     Right Ear: Ear canal normal.     Left Ear: Ear canal normal.     Ears:     Comments: Bilateral ear effusion without erythema or  injection    Nose: Congestion present.     Right Sinus: Maxillary sinus tenderness present. No frontal sinus tenderness.     Left Sinus: Maxillary sinus tenderness present. No frontal sinus tenderness.  Mouth/Throat:     Mouth: Mucous membranes are moist.     Pharynx: Oropharynx is clear.  Eyes:     Conjunctiva/sclera: Conjunctivae normal.     Pupils: Pupils are equal, round, and reactive to light.  Cardiovascular:     Rate and Rhythm: Normal rate and regular rhythm.     Pulses: Normal pulses.     Heart sounds: Normal heart sounds.  Pulmonary:     Effort: Pulmonary effort is normal.     Breath sounds: No wheezing.     Comments: Cough with forced expiration Abdominal:     Palpations: Abdomen is soft.  Lymphadenopathy:     Cervical: Cervical adenopathy present.  Skin:    General: Skin is warm and dry.     Capillary Refill: Capillary refill takes less than 2 seconds.  Neurological:     General: No focal deficit present.     Mental Status: She is alert and oriented to person, place, and time.      UC Treatments / Results  Labs (all labs ordered are listed, but only abnormal results are displayed) Labs Reviewed  SARS CORONAVIRUS 2 (TAT 6-24 HRS)    EKG   Radiology No results found.  Procedures Procedures (including critical care time)  Medications Ordered in UC Medications - No data to display  Initial Impression / Assessment and Plan / UC Course  I have reviewed the triage vital signs and the nursing notes.  Pertinent labs & imaging results that were available during my care of the patient were reviewed by me and considered in my medical decision making (see chart for details).    Patient with ongoing upper respiratory symptoms for 2-1/2 weeks, now with increased sinus symptoms and continued cough.  Will give her prescription for azithromycin recommend over-the-counter allergy medications as well as saline/sinus rinse to help clear out the passages.  Throughout  the rest, recommend continue Benadryl as needed and to see if she can find any other source.  Otherwise will recommend she stop the other over-the-counter medications in case those are the cause. Final Clinical Impressions(s) / UC Diagnoses   Final diagnoses:  Upper respiratory tract infection, unspecified type  Acute sinusitis, recurrence not specified, unspecified location  Hives     Discharge Instructions     -Azithromycin:Take 2 tablets by mouth the first day followed by one tablet daily for next 4 days. -Albuterol: 1-2 puffs every 6 hours as needed for cough or wheezing -Continue the Benadryl as needed for the hives.  Continue looking for any causes.  Heat rash could also be a cause.  If fever over-the-counter medications have been started recently, those could be a possible cause as well  -Push fluids -Rest -Follow-up with primary care provider as needed.    ED Prescriptions    Medication Sig Dispense Auth. Provider   azithromycin (ZITHROMAX Z-PAK) 250 MG tablet Take 2 tablets by mouth the first day followed by one tablet daily for next 4 days. 6 tablet Luvenia Redden, PA-C   albuterol (VENTOLIN HFA) 108 (90 Base) MCG/ACT inhaler Inhale 1-2 puffs into the lungs every 6 (six) hours as needed for wheezing or shortness of breath. 8 g Luvenia Redden, PA-C     PDMP not reviewed this encounter.   Luvenia Redden, PA-C 11/19/19 2025

## 2019-11-20 ENCOUNTER — Ambulatory Visit: Payer: Medicaid Other | Admitting: Gastroenterology

## 2019-11-20 LAB — SARS CORONAVIRUS 2 (TAT 6-24 HRS): SARS Coronavirus 2: NEGATIVE

## 2019-12-01 ENCOUNTER — Other Ambulatory Visit: Payer: Self-pay

## 2019-12-01 ENCOUNTER — Ambulatory Visit
Admission: EM | Admit: 2019-12-01 | Discharge: 2019-12-01 | Disposition: A | Discharge status: Home / Self Care | Payer: Medicaid Other | Attending: Emergency Medicine | Admitting: Emergency Medicine

## 2019-12-01 DIAGNOSIS — J309 Allergic rhinitis, unspecified: Secondary | ICD-10-CM | POA: Diagnosis not present

## 2019-12-01 DIAGNOSIS — B0089 Other herpesviral infection: Secondary | ICD-10-CM

## 2019-12-01 LAB — MONONUCLEOSIS SCREEN: Mono Screen: NEGATIVE

## 2019-12-01 MED ORDER — PREDNISONE 10 MG (21) PO TBPK
ORAL_TABLET | ORAL | 0 refills | Status: DC
Start: 2019-12-01 — End: 2021-02-26

## 2019-12-01 NOTE — ED Provider Notes (Signed)
[facility] [place]   Name: Rose Tate DOB: 1980-11-17 MRN: [id number] CSN: [id number] [facility]CP: [facility]  Arrival date and time:  12/01/19 1136  Chief Complaint:  Sore Throat and Hand Pain   NOTE: Prior to seeing the patient today, I have reviewed the triage nursing documentation and vital signs. Clinical staff has updated patient's PMH/PSHx, current medication list, and drug allergies/intolerances to ensure comprehensive history available to assist in medical decision making.   History:   HPI: Rose Tate is a 39 y.o. female who presents today with complaints of sore throat and cough.  Patient states she was seen for the same symptoms about 2 weeks ago and was treated with a 5-day course of azithromycin.  She states her cough has resolved, but her sore throat has continued.  Her sore throat is interfering with her ability to eat.  Her sore throat is not tender to touch.  She denies any headaches or sinus discomfort.  She has also noticed some bilateral ear pain.  She does not take any allergy medication.  She denies any known exposure to COVID-19.  She is concerned that she has contracted mononucleosis.  She is also complaining of some "bumps" to her right pointer finger.  She first noticed these bumps a week ago after returning from a pool party.  She states the area is itchy and burns, but there is no pain or swelling to the area.  She has been treating the discomfort with over-the-counter Benadryl cream with some mild relief.  She denies any bites to the area.     Past Medical History:  Diagnosis Date   Diabetes mellitus without complication (HCC)    pre diabetes   Hypertension     Past Surgical History:  Procedure Laterality Date   CESAREAN SECTION     times 3    History reviewed. No pertinent family history.  Social History   Tobacco Use   Smoking status: Current Every Day Smoker    Packs/day: 0.50    Types:  Cigarettes   Smokeless tobacco: Never Used  Substance Use Topics   Alcohol use: Yes    Comment: occasionally   Drug use: No    There are no problems to display for this patient.   Home Medications:    No outpatient medications have been marked as taking for the 12/01/19 encounter [facility] Encounter).    Allergies:   Patient has no known allergies.  Review of Systems (ROS): Review of Systems  Constitutional: Negative for chills, fatigue and fever.  HENT: Positive for postnasal drip, sore throat and trouble swallowing. Negative for congestion, ear discharge, ear pain and facial swelling.   Respiratory: Positive for cough. Negative for choking, chest tightness, shortness of breath and wheezing.   Gastrointestinal: Negative for abdominal pain, nausea and vomiting.  Skin: Positive for wound.     Vital Signs: Today's Vitals   12/01/19 1147 12/01/19 1148 12/01/19 1150 12/01/19 1228  BP:   120/87   Pulse:   91   Resp:   20   Temp:   98.4 F (36.9 C)   SpO2:   97%   Weight:  279 lb 15.8 oz (127 kg)    Height:  5' 7.5" (1.715 m)    PainSc: 7    3     Physical Exam: Physical Exam Vitals and nursing note reviewed.  Constitutional:      Appearance: She is well-developed.  HENT:  Right Ear: A middle ear effusion is present.     Left Ear: A middle ear effusion is present.     Mouth/Throat:     Pharynx: Posterior oropharyngeal erythema present. No oropharyngeal exudate.     Tonsils: No tonsillar exudate.  Skin:    General: Skin is warm and dry.     Findings: Rash present. Rash is vesicular.     Comments: Cluster of vesicles to left pointer finger consistent with herpetic whitlow.  No erythema seen surrounding cluster.  Neurological:     Mental Status: She is alert.      Urgent Care Treatments / Results:   LABS: PLEASE NOTE: all labs that were ordered this encounter are listed, however only abnormal results are displayed. Labs Reviewed  MONONUCLEOSIS SCREEN     EKG: -None  RADIOLOGY: No results found.  PROCEDURES: Procedures  MEDICATIONS RECEIVED THIS VISIT: Medications - No data to display  PERTINENT CLINICAL COURSE NOTES/UPDATES:   Initial Impression / Assessment and Plan / Urgent Care Course:  Pertinent labs & imaging results that were available during my care of the patient were personally reviewed by me and considered in my medical decision making (see lab/imaging section of note for values and interpretations).  Rose Tate is a 39 y.o. female who presents to [facility] today with complaints of sore throat and finger rash, diagnosed with allergies and herpetic whitlow, and treated as such with the medications below. NP and patient reviewed discharge instructions below during visit.   Patient is well appearing overall in clinic today. She does not appear to be in any acute distress. Presenting symptoms (see HPI) and exam as documented above.   I have reviewed the follow up and strict return precautions for any new or worsening symptoms. Patient is aware of symptoms that would be deemed urgent/emergent, and would thus require further evaluation either here or in the emergency department. At the time of discharge, she verbalized understanding and consent with the discharge plan as it was reviewed with her. All questions were fielded by provider and/or clinic staff prior to patient discharge.    Final Clinical Impressions / Urgent Care Diagnoses:   Final diagnoses:  Allergic rhinitis, unspecified seasonality, unspecified trigger  Herpetic whitlow    New Prescriptions:  [place] Controlled Substance Registry consulted? Not Applicable  Meds ordered this encounter  Medications   predniSONE (STERAPRED UNI-PAK 21 TAB) 10 MG (21) TBPK tablet    Sig: Take as instructed on package (60, 50, 40, 30, 20, 10)    Dispense:  1 each    Refill:  0      Discharge Instructions     You were seen for sore throat and hand rash and  are being treated for allergies and viral rash on hand (herpetic whitlow).   Allergies -Use over-the-counter allergy medication such as cetirizine (Zyrtec) or levocetirizine (Xyzal). -Flonase in each nostril 2 times a day was also help with your ear pressure and drainage. -Take your steroids as prescribed. -You will be notified of your results of the mononucleosis test  Herpetic whitlow -It is a viral rash in the same family as cold sores and no further treatment is needed. -Treat the symptoms with over-the-counter medications such as topical cortisone and Benadryl. ----  Take care, Dr. Sharlet Salina, NP-c     Recommended Follow up Care:  Patient encouraged to follow up with the following provider within the specified time frame, or sooner as dictated by the severity of her symptoms.  As always, she was instructed that for any urgent/emergent care needs, she should seek care either here or in the emergency department for more immediate evaluation.   Bailey Mech, DNP, NP-c    Bailey Mech, NP 12/01/19 Flossie Buffy

## 2019-12-01 NOTE — Discharge Instructions (Signed)
You were seen for sore throat and hand rash and are being treated for allergies and viral rash on hand (herpetic whitlow).   Allergies -Use over-the-counter allergy medication such as cetirizine (Zyrtec) or levocetirizine (Xyzal). -Flonase in each nostril 2 times a day was also help with your ear pressure and drainage. -Take your steroids as prescribed. -You will be notified of your results of the mononucleosis test  Herpetic whitlow -It is a viral rash in the same family as cold sores and no further treatment is needed. -Treat the symptoms with over-the-counter medications such as topical cortisone and Benadryl. ----  Take care, Dr. Sharlet Salina, NP-c

## 2019-12-01 NOTE — ED Triage Notes (Signed)
Pt reports having a sore throat and cough for over 2 weeks. Pt was treated for URI with a Zpack 6/21 but sts symptoms are not getting any better. Neg covid test on 6/21.  Pt reports having small bumps to R index finger. Bumps itch and burn. Bumps were noticed last Saturday.

## 2020-02-10 ENCOUNTER — Other Ambulatory Visit: Payer: Self-pay

## 2020-02-10 ENCOUNTER — Ambulatory Visit
Admission: EM | Admit: 2020-02-10 | Discharge: 2020-02-10 | Disposition: A | Discharge status: Home / Self Care | Payer: Medicaid Other | Attending: Emergency Medicine | Admitting: Emergency Medicine

## 2020-02-10 DIAGNOSIS — R519 Headache, unspecified: Secondary | ICD-10-CM | POA: Diagnosis present

## 2020-02-10 DIAGNOSIS — Z791 Long term (current) use of non-steroidal anti-inflammatories (NSAID): Secondary | ICD-10-CM | POA: Diagnosis not present

## 2020-02-10 DIAGNOSIS — Z79899 Other long term (current) drug therapy: Secondary | ICD-10-CM | POA: Insufficient documentation

## 2020-02-10 DIAGNOSIS — F17210 Nicotine dependence, cigarettes, uncomplicated: Secondary | ICD-10-CM | POA: Diagnosis not present

## 2020-02-10 DIAGNOSIS — E119 Type 2 diabetes mellitus without complications: Secondary | ICD-10-CM | POA: Insufficient documentation

## 2020-02-10 DIAGNOSIS — J069 Acute upper respiratory infection, unspecified: Secondary | ICD-10-CM | POA: Insufficient documentation

## 2020-02-10 DIAGNOSIS — I10 Essential (primary) hypertension: Secondary | ICD-10-CM | POA: Diagnosis not present

## 2020-02-10 DIAGNOSIS — Z7984 Long term (current) use of oral hypoglycemic drugs: Secondary | ICD-10-CM | POA: Diagnosis not present

## 2020-02-10 DIAGNOSIS — Z20822 Contact with and (suspected) exposure to covid-19: Secondary | ICD-10-CM | POA: Diagnosis not present

## 2020-02-10 NOTE — ED Triage Notes (Signed)
Patient states that she has been having a headache x 1 week with sinus pressure and ear pressure with what she describes as severe exhaustion. Has not tested for covid. Had one vaccine April.

## 2020-02-10 NOTE — Discharge Instructions (Signed)
Your COVID test is pending.  You should self quarantine until the test result is back.    Take Tylenol as needed for fever or discomfort.  Rest and keep yourself hydrated.    Go to the emergency department if you develop acute worsening symptoms.     

## 2020-02-10 NOTE — ED Provider Notes (Signed)
[facility]    CSN: [id number] Arrival date & time: 02/10/20  1326      History   Chief Complaint Chief Complaint  Patient presents with  . Headache    HPI Rose Tate is a 39 y.o. female.   Patient presents with 7-10 day week history of headache and fatigue.  She states she has felt warm but did not take her temperature.  She also reports sinus pressure and ear fullness.  She denies cough, shortness of breath, abdominal pain, vomiting, diarrhea, rash, or other symptoms.  No treatment attempted at home.  She received 1 Pfizer COVID vaccine in April but did not take the second one.       The history is provided by the patient.    Past Medical History:  Diagnosis Date  . Diabetes mellitus without complication (HCC)    pre diabetes  . Hypertension     There are no problems to display for this patient.   Past Surgical History:  Procedure Laterality Date  . CESAREAN SECTION     times 3    OB History   No obstetric history on file.      Home Medications    Prior to Admission medications   Medication Sig Start Date End Date Taking? Authorizing Provider  albuterol (VENTOLIN HFA) 108 (90 Base) MCG/ACT inhaler Inhale 1-2 puffs into the lungs every 6 (six) hours as needed for wheezing or shortness of breath. 11/19/19  Yes Candis Schatz, PA-C  buPROPion HCl (WELLBUTRIN PO) Take by mouth.   Yes [provider]  carisoprodol (SOMA) 350 MG tablet Take 1 tablet (350 mg total) by mouth 3 (three) times daily as needed. 06/07/18  Yes Schaevitz, Myra Rude, MD  cephALEXin (KEFLEX) 500 MG capsule Take 1 capsule (500 mg total) by mouth 4 (four) times daily. 10/09/15  Yes Cuthriell, Delorise Royals, PA-C  divalproex (DEPAKOTE) 500 MG DR tablet divalproex 500 mg tablet,delayed release   Yes [provider]  empagliflozin (JARDIANCE) 10 MG TABS tablet Jardiance 10 mg tablet   Yes [provider]  esomeprazole (NEXIUM) 40 MG capsule Take 40 mg  by mouth daily at 12 noon.   Yes [provider]  furosemide (LASIX) 20 MG tablet Take 20 mg by mouth daily.   Yes [provider]  glipiZIDE (GLUCOTROL) 10 MG tablet glipizide 10 mg tablet   Yes [provider]  hydrochlorothiazide (HYDRODIURIL) 25 MG tablet Take 1 tablet (25 mg total) by mouth daily. 07/17/17  Yes Irean Hong, MD  hydrochlorothiazide (HYDRODIURIL) 25 MG tablet Take 25 mg by mouth daily.   Yes [provider]  losartan (COZAAR) 25 MG tablet Take 100 mg by mouth daily.   Yes [provider]  meclizine (ANTIVERT) 25 MG tablet Take 1 tablet (25 mg total) by mouth 3 (three) times daily as needed for dizziness or nausea. 07/17/17  Yes Irean Hong, MD  meloxicam (MOBIC) 15 MG tablet meloxicam 15 mg tablet  Take 1 tablet every day by oral route.   Yes [provider]  metFORMIN (GLUCOPHAGE) 500 MG tablet Take 500 mg by mouth 2 (two) times daily with a meal.   Yes [provider]  mupirocin ointment (BACTROBAN) 2 % Apply three times a day for 5 days. 12/30/15  Yes Renford Dills, NP  ondansetron (ZOFRAN ODT) 4 MG disintegrating tablet Take 1 tablet (4 mg total) by mouth every 8 (eight) hours as needed for nausea or vomiting. 07/17/17  Yes Irean HongSung, Jade J, MD  sitaGLIPtin (JANUVIA) 100 MG tablet Januvia 100 mg tablet   Yes [provider]  azithromycin (ZITHROMAX Z-PAK) 250 MG tablet Take 2 tablets by mouth the first day followed by one tablet daily for next 4 days. 11/19/19   Candis SchatzHarris, Michael D, PA-C  cyclobenzaprine (FLEXERIL) 10 MG tablet cyclobenzaprine 10 mg tablet  Take 1 tablet by oral route at bedtime.    [provider]  diphenhydrAMINE (BENADRYL ALLERGY) 25 mg capsule Take 1 capsule (25 mg total) by mouth every 6 (six) hours as needed for up to 5 days. 02/16/18 02/21/18  Orvil FeilWoods, Jaclyn M, PA-C  famotidine (PEPCID) 20 MG tablet Take 1 tablet (20 mg total) by mouth 2 (two) times daily for 5 days. 02/16/18 02/21/18   Orvil FeilWoods, Jaclyn M, PA-C  oxyCODONE-acetaminophen (ROXICET) 5-325 MG tablet Take 1 tablet by mouth every 6 (six) hours as needed for moderate pain. 08/11/16   Joni ReiningSmith, Ronald K, PA-C  predniSONE (STERAPRED UNI-PAK 21 TAB) 10 MG (21) TBPK tablet Take as instructed on package (60, 50, 40, 30, 20, 10) 12/01/19   Bailey MechBenjamin, Lunise, NP  sulfamethoxazole-trimethoprim (BACTRIM) 400-80 MG tablet Take 1 tablet by mouth 2 (two) times daily. 12/30/15   Renford DillsMiller, Lindsey, NP  traMADol (ULTRAM) 50 MG tablet tramadol 50 mg tablet  Take 1 tablet every 6 hours by oral route.    [provider]  losartan-hydrochlorothiazide (HYZAAR) 50-12.5 MG tablet Take 1 tablet by mouth daily.  11/19/19  [provider]    Family History History reviewed. No pertinent family history.  Social History Social History   Tobacco Use  . Smoking status: Current Every Day Smoker    Packs/day: 0.50    Types: Cigarettes  . Smokeless tobacco: Never Used  Vaping Use  . Vaping Use: Never used  Substance Use Topics  . Alcohol use: Yes    Comment: occasionally  . Drug use: No     Allergies   Patient has no known allergies.   Review of Systems Review of Systems  Constitutional: Positive for fatigue. Negative for chills and fever.  HENT: Positive for ear pain and sinus pressure. Negative for congestion and sore throat.   Eyes: Negative for pain and visual disturbance.  Respiratory: Negative for cough and shortness of breath.   Cardiovascular: Negative for chest pain and palpitations.  Gastrointestinal: Negative for abdominal pain, diarrhea and vomiting.  Genitourinary: Negative for dysuria and hematuria.  Musculoskeletal: Negative for arthralgias and back pain.  Skin: Negative for color change and rash.  Neurological: Positive for headaches. Negative for seizures and syncope.  All other systems reviewed and are negative.    Physical Exam Triage Vital Signs ED Triage Vitals  Enc Vitals Group     BP 02/10/20  1412 105/75     Pulse Rate 02/10/20 1412 86     Resp 02/10/20 1412 18     Temp 02/10/20 1412 98 F (36.7 C)     Temp Source 02/10/20 1412 Oral     SpO2 02/10/20 1412 96 %     Weight 02/10/20 1409 290 lb (131.5 kg)     Height 02/10/20 1409 5\' 8"  (1.727 m)     Head Circumference --      Peak Flow --      Pain Score --      Pain Loc --      Pain Edu? --      Excl. in GC? --    No data found.  Updated Vital Signs BP 105/75 (BP Location: Left Arm)   Pulse 86   Temp 98 F (36.7 C) (Oral)   Resp 18   Ht 5\' 8"  (1.727 m)   Wt 290 lb (131.5 kg)   LMP 01/13/2020   SpO2 96%   BMI 44.09 kg/m   Visual Acuity Right Eye Distance:   Left Eye Distance:   Bilateral Distance:    Right Eye Near:   Left Eye Near:    Bilateral Near:     Physical Exam Vitals and nursing note reviewed.  Constitutional:      General: She is not in acute distress.    Appearance: She is well-developed. She is not ill-appearing.  HENT:     Head: Normocephalic and atraumatic.     Right Ear: Tympanic membrane normal.     Left Ear: Tympanic membrane normal.     Nose: Nose normal.     Mouth/Throat:     Mouth: Mucous membranes are moist.     Pharynx: Oropharynx is clear.  Eyes:     Conjunctiva/sclera: Conjunctivae normal.  Cardiovascular:     Rate and Rhythm: Normal rate and regular rhythm.     Heart sounds: No murmur heard.   Pulmonary:     Effort: Pulmonary effort is normal. No respiratory distress.     Breath sounds: Normal breath sounds.  Abdominal:     Palpations: Abdomen is soft.     Tenderness: There is no abdominal tenderness.  Musculoskeletal:     Cervical back: Neck supple.  Skin:    General: Skin is warm and dry.     Findings: No rash.  Neurological:     General: No focal deficit present.     Mental Status: She is alert and oriented to person, place, and time.     Gait: Gait normal.  Psychiatric:        Mood and Affect: Mood normal.        Behavior: Behavior normal.      UC  Treatments / Results  Labs (all labs ordered are listed, but only abnormal results are displayed) Labs Reviewed  SARS CORONAVIRUS 2 (TAT 6-24 HRS)    EKG   Radiology No results found.  Procedures Procedures (including critical care time)  Medications Ordered in UC Medications - No data to display  Initial Impression / Assessment and Plan / UC Course  I have reviewed the triage vital signs and the nursing notes.  Pertinent labs & imaging results that were available during my care of the patient were reviewed by me and considered in my medical decision making (see chart for details).   Viral URI.  PCR COVID pending.  Instructed patient to self quarantine until the test result is back.  Discussed symptomatic treatment including Tylenol, rest, hydration.  Instructed patient to go to the ED if she has acute worsening symptoms.  Patient agrees to plan of care.    Final Clinical Impressions(s) / UC Diagnoses   Final diagnoses:  Viral URI     Discharge Instructions     Your COVID test is pending.  You should self quarantine until the test result is back.    Take Tylenol as needed for fever or discomfort.  Rest and keep yourself hydrated.    Go to the emergency department if you develop acute worsening symptoms.        ED Prescriptions    None     PDMP not reviewed this encounter.   01/15/2020,  NP 02/10/20 1428

## 2020-02-11 LAB — SARS CORONAVIRUS 2 (TAT 6-24 HRS): SARS Coronavirus 2: NEGATIVE

## 2020-07-24 IMAGING — CR CHEST - 2 VIEW
1 series · 2 of 2 positions shown · non-contrast
Comparison: 09/08/2008 chest radiograph

CLINICAL DATA: Chest pain and shortness of breath

EXAM:
CHEST - 2 VIEW

[Series 2: w chest lat · 0.14mm/px · 2 of 2 slices shown]
[im 1/2]
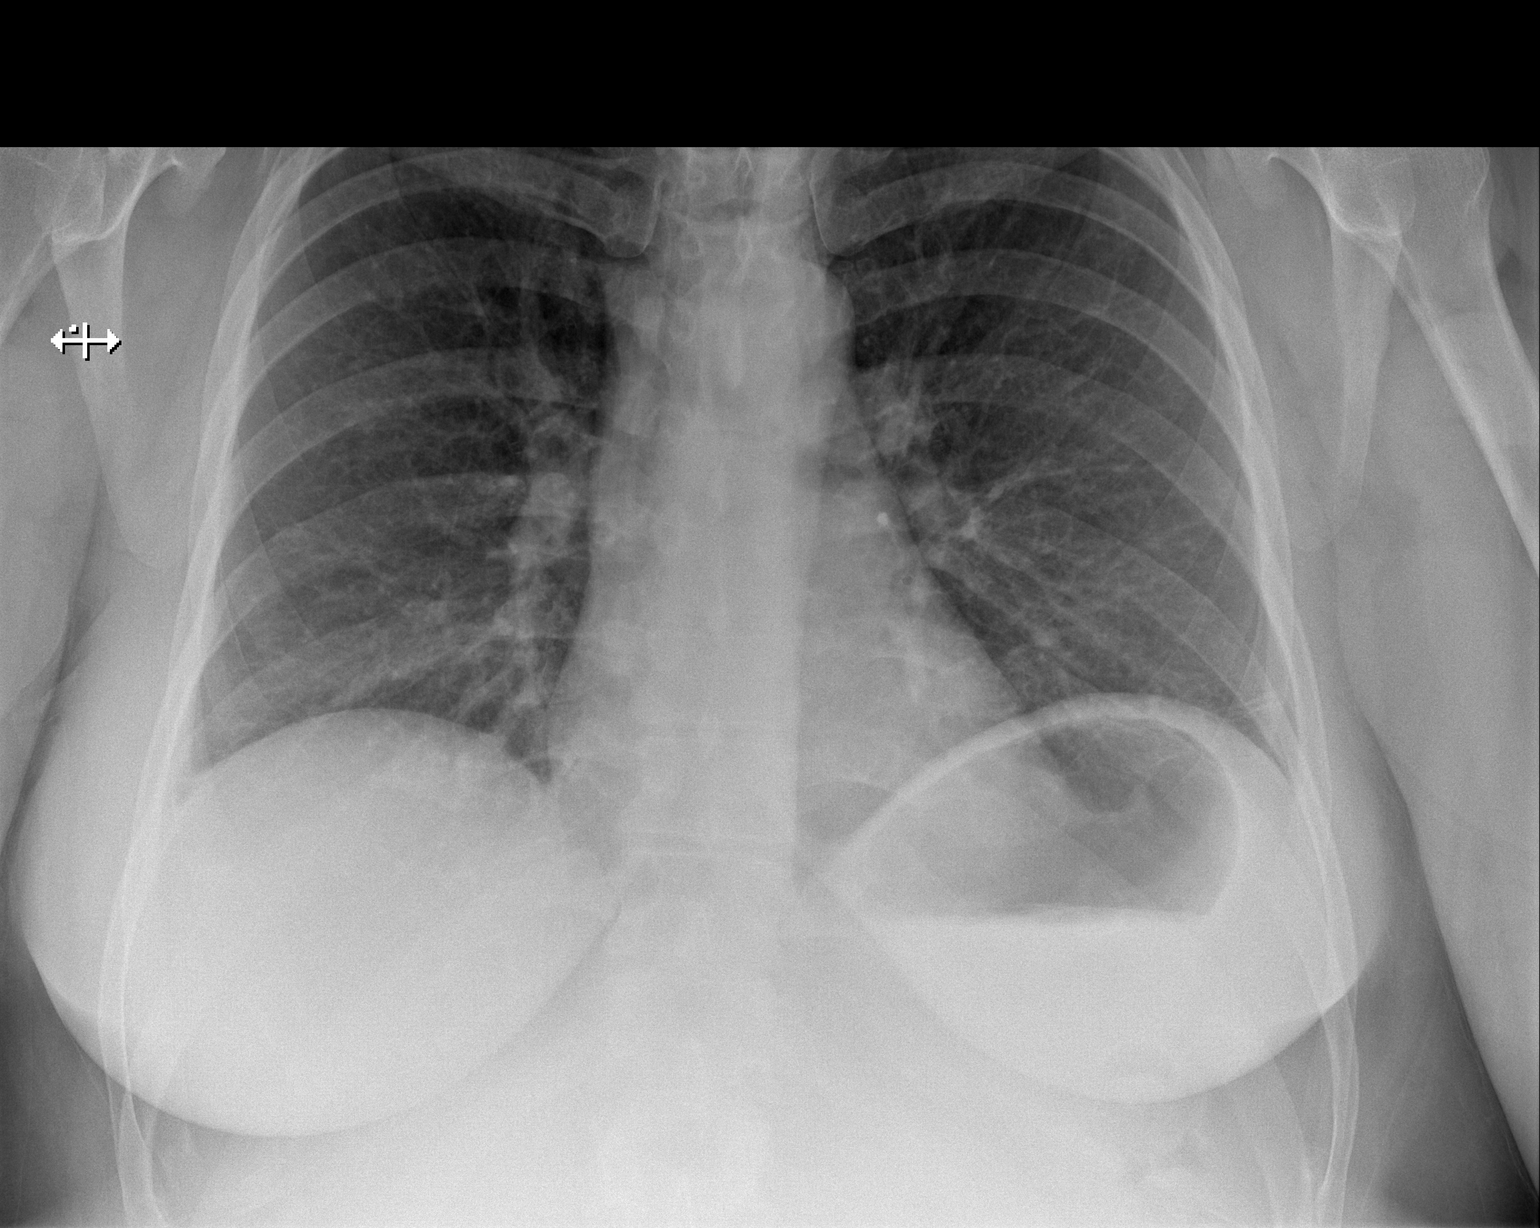
[im 2/2]
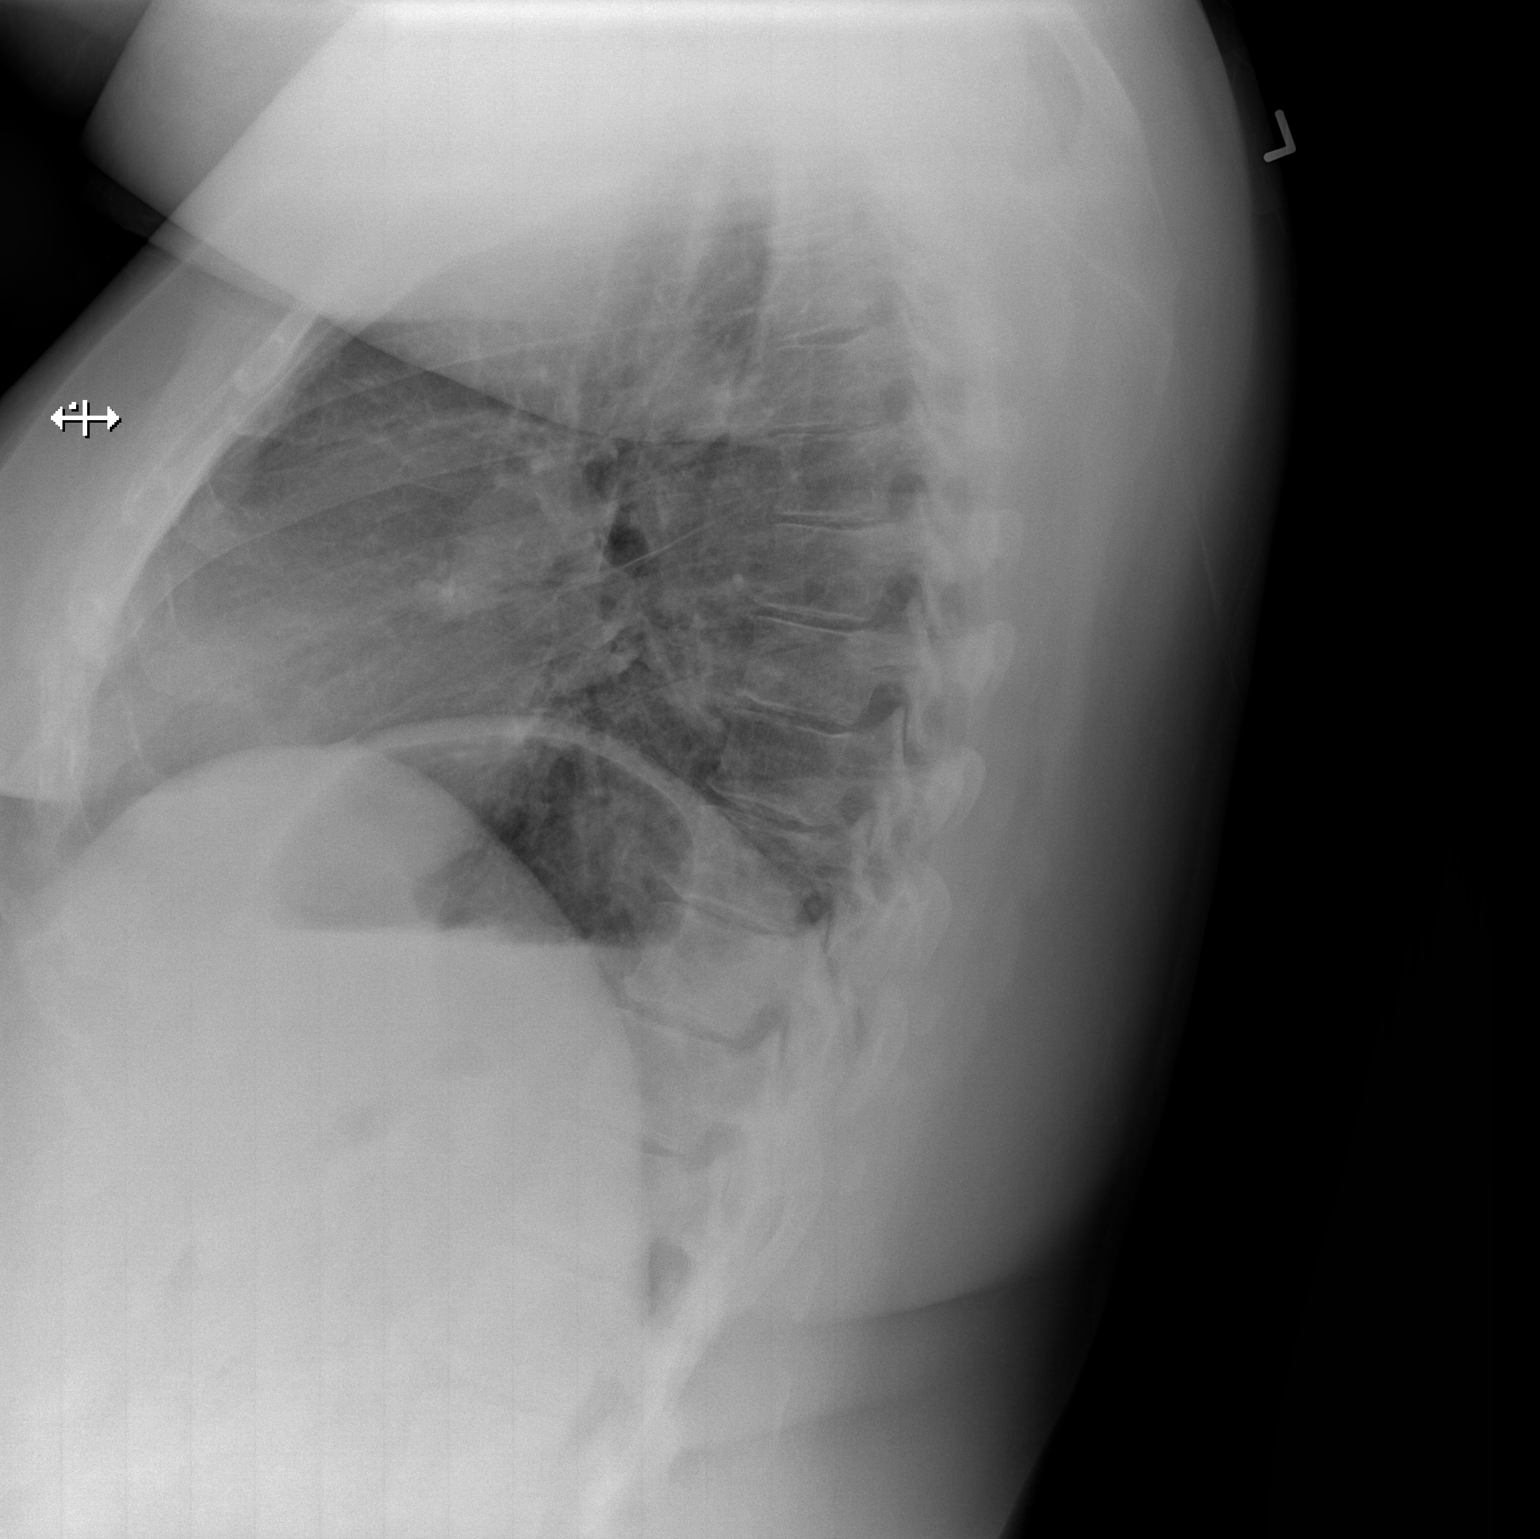

[2 of 2 positions shown; findings below may reference images not displayed]

FINDINGS: The cardiomediastinal silhouette is unremarkable.

There is no evidence of focal airspace disease, pulmonary edema,
suspicious pulmonary nodule/mass, pleural effusion, or pneumothorax.

No acute bony abnormalities are identified.
IMPRESSION: No active cardiopulmonary disease.

## 2020-09-01 DIAGNOSIS — E1165 Type 2 diabetes mellitus with hyperglycemia: Secondary | ICD-10-CM | POA: Insufficient documentation

## 2020-09-01 DIAGNOSIS — F4310 Post-traumatic stress disorder, unspecified: Secondary | ICD-10-CM | POA: Insufficient documentation

## 2021-02-26 ENCOUNTER — Ambulatory Visit
Admission: EM | Admit: 2021-02-26 | Discharge: 2021-02-26 | Disposition: A | Discharge status: Home / Self Care | Payer: Medicaid Other

## 2021-02-26 ENCOUNTER — Other Ambulatory Visit: Payer: Self-pay

## 2021-02-26 DIAGNOSIS — T148XXA Other injury of unspecified body region, initial encounter: Secondary | ICD-10-CM | POA: Diagnosis not present

## 2021-02-26 MED ORDER — CEPHALEXIN 500 MG PO CAPS: 500.0000 mg | ORAL_CAPSULE | Freq: Three times a day (TID) | ORAL | 0 refills | Status: AC

## 2021-02-26 NOTE — ED Triage Notes (Signed)
Pt c/o fall this past Sunday and states she has a scrape to the right knee. Pt is just wanting to have the wound checked. Pt states she is diabetic and is concerned about infection.

## 2021-02-26 NOTE — Discharge Instructions (Addendum)
Take the Keflex 3 times a day for 5 days.Take with food.  Keep the wound open to the air while at home and cover loosely with a non-stick dressing until a scab has formed.  Return for re-evaluation for increased redness, drainage, red streaks going up your thigh, fullness in your groin, or fever.

## 2021-02-26 NOTE — ED Provider Notes (Signed)
[facility]    CSN: [id number] Arrival date & time: 02/26/21  1518      History   Chief Complaint Chief Complaint  Patient presents with   Knee Pain    right    HPI Rose Tate is a 40 y.o. female.   HPI  40 year old female here for evaluation of abrasion to her right knee.  Patient reports that she suffered a fall on concrete 4 days ago and suffered an abrasion to her right knee.  She has been keeping the area clean and dry at home.  She just wants it to be evaluated to make sure it is not getting infected since she is diabetic.  She states that there was a scab on the wound but since she started wearing pants with colder weather with her squatting and bending the scab has rubbed free.  The wound is now draining a serous drainage.  Past Medical History:  Diagnosis Date   Diabetes mellitus without complication (HCC)    pre diabetes   Hypertension     There are no problems to display for this patient.   Past Surgical History:  Procedure Laterality Date   CESAREAN SECTION     times 3    OB History   No obstetric history on file.      Home Medications    Prior to Admission medications   Medication Sig Start Date End Date Taking? Authorizing Provider  albuterol (VENTOLIN HFA) 108 (90 Base) MCG/ACT inhaler Inhale 1-2 puffs into the lungs every 6 (six) hours as needed for wheezing or shortness of breath. 11/19/19  Yes Candis Schatz, PA-C  carisoprodol (SOMA) 350 MG tablet Take 1 tablet (350 mg total) by mouth 3 (three) times daily as needed. 06/07/18  Yes Schaevitz, Myra Rude, MD  cephALEXin (KEFLEX) 500 MG capsule Take 1 capsule (500 mg total) by mouth 3 (three) times daily for 5 days. 02/26/21 03/03/21 Yes Becky Augusta, NP  Dulaglutide (TRULICITY) 0.75 MG/0.5ML SOPN Trulicity 0.75 mg/0.5 mL subcutaneous pen injector  ADMINISTER 0.75 MG UNDER THE SKIN EVERY 7 DAYS   Yes [provider]  DULoxetine (CYMBALTA) 30 MG capsule Take 30 mg by  mouth daily. 09/01/20  Yes [provider]  empagliflozin (JARDIANCE) 10 MG TABS tablet Jardiance 10 mg tablet   Yes [provider]  FEROSUL 325 (65 Fe) MG tablet Take 325 mg by mouth every other day. 09/01/20  Yes [provider]  losartan (COZAAR) 25 MG tablet Take 1 tablet by mouth daily. 04/28/20 04/28/21 Yes [provider]  metFORMIN (GLUCOPHAGE) 500 MG tablet Take 500 mg by mouth 2 (two) times daily with a meal.   Yes [provider]  VYVANSE 30 MG capsule Take 30 mg by mouth daily as needed. 10/09/20  Yes [provider]  diphenhydrAMINE (BENADRYL ALLERGY) 25 mg capsule Take 1 capsule (25 mg total) by mouth every 6 (six) hours as needed for up to 5 days. 02/16/18 02/21/18  Orvil Feil, PA-C  esomeprazole (NEXIUM) 40 MG capsule Take 40 mg by mouth daily at 12 noon.    [provider]  famotidine (PEPCID) 20 MG tablet Take 1 tablet (20 mg total) by mouth 2 (two) times daily for 5 days. 02/16/18 02/21/18  Orvil Feil, PA-C  meloxicam (MOBIC) 15 MG tablet meloxicam 15 mg tablet  Take 1 tablet every day by oral route.    [provider]  mupirocin ointment (BACTROBAN) 2 % Apply three times  a day for 5 days. 12/30/15   Renford Dills, NP  traMADol (ULTRAM) 50 MG tablet tramadol 50 mg tablet  Take 1 tablet every 6 hours by oral route.    [provider]  losartan-hydrochlorothiazide (HYZAAR) 50-12.5 MG tablet Take 1 tablet by mouth daily.  11/19/19  [provider]    Family History History reviewed. No pertinent family history.  Social History Social History   Tobacco Use   Smoking status: Every Day    Packs/day: 0.50    Types: Cigarettes   Smokeless tobacco: Never  Vaping Use   Vaping Use: Never used  Substance Use Topics   Alcohol use: Yes    Comment: occasionally   Drug use: No     Allergies   Patient has no known allergies.   Review of Systems Review of Systems  Constitutional:   Negative for activity change, appetite change and fever.  Skin:  Positive for color change and wound.  Neurological:  Negative for weakness and numbness.  Hematological: Negative.     Physical Exam Triage Vital Signs ED Triage Vitals  Enc Vitals Group     BP 02/26/21 1554 118/86     Pulse Rate 02/26/21 1554 80     Resp 02/26/21 1554 18     Temp 02/26/21 1554 98.6 F (37 C)     Temp Source 02/26/21 1554 Oral     SpO2 02/26/21 1554 100 %     Weight 02/26/21 1549 260 lb (117.9 kg)     Height 02/26/21 1549 5\' 8"  (1.727 m)     Head Circumference --      Peak Flow --      Pain Score 02/26/21 1548 7     Pain Loc --      Pain Edu? --      Excl. in GC? --    No data found.  Updated Vital Signs BP 118/86 (BP Location: Left Arm)   Pulse 80   Temp 98.6 F (37 C) (Oral)   Resp 18   Ht 5\' 8"  (1.727 m)   Wt 260 lb (117.9 kg)   LMP 02/12/2021 (Approximate)   SpO2 100%   BMI 39.53 kg/m   Visual Acuity Right Eye Distance:   Left Eye Distance:   Bilateral Distance:    Right Eye Near:   Left Eye Near:    Bilateral Near:     Physical Exam Vitals and nursing note reviewed.  Constitutional:      General: She is not in acute distress.    Appearance: Normal appearance. She is not ill-appearing.  Musculoskeletal:        General: No swelling, tenderness or deformity. Normal range of motion.  Skin:    General: Skin is warm and dry.     Capillary Refill: Capillary refill takes less than 2 seconds.     Findings: Bruising and lesion present.  Neurological:     General: No focal deficit present.     Mental Status: She is alert and oriented to person, place, and time.  Psychiatric:        Mood and Affect: Mood normal.        Behavior: Behavior normal.        Thought Content: Thought content normal.        Judgment: Judgment normal.     UC Treatments / Results  Labs (all labs ordered are listed, but only abnormal results are displayed) Labs Reviewed - No data to  display  EKG  Radiology No results found.  Procedures Procedures (including critical care time)  Medications Ordered in UC Medications - No data to display  Initial Impression / Assessment and Plan / UC Course  I have reviewed the triage vital signs and the nursing notes.  Pertinent labs & imaging results that were available during my care of the patient were reviewed by me and considered in my medical decision making (see chart for details).  Is a very pleasant, nontoxic-appearing 40 year old female here for evaluation of an abrasion on her left knee that she sustained 4 days ago when she fell on concrete.  She is concerned that she might be developing an infection because it is draining a serous fluid.  The drainage started today.  She states that she did have a scab over the wound until she started wearing long pants with the colder weather.  While at work she does a lot of squatting and bending which has caused the scab to rub off of the abraded area.  Physical exam reveals some mild erythema around the wound edges.  The wound is an abrasion approximately size of a silver dollar.  The area is hot and there is serous drainage from the wound.  There is some ecchymosis inferior and medial to the abrasion on the knee.  Patient is full range of motion of her knee and states that she can walk on her knee without pain.  DP & PT pulses in the right lower leg are 2+.  Given patient's history of diabetes will cover patient empirically with 5 mg Keflex 3 times daily x5 days to prevent infection.  I have advised her to keep the wound open to the air while at home and then cover loosely with a nonstick dressing when at work to prevent the scab from being disrupted once it forms.  ER and return precautions reviewed with patient.   Final Clinical Impressions(s) / UC Diagnoses   Final diagnoses:  Abrasion     Discharge Instructions      Take the Keflex 3 times a day for 5 days.Take with  food.  Keep the wound open to the air while at home and cover loosely with a non-stick dressing until a scab has formed.  Return for re-evaluation for increased redness, drainage, red streaks going up your thigh, fullness in your groin, or fever.      ED Prescriptions     Medication Sig Dispense Auth. Provider   cephALEXin (KEFLEX) 500 MG capsule Take 1 capsule (500 mg total) by mouth 3 (three) times daily for 5 days. 15 capsule Becky Augusta, NP      PDMP not reviewed this encounter.   Becky Augusta, NP 02/26/21 [phone]

## 2021-07-31 ENCOUNTER — Telehealth: Payer: Self-pay | Admitting: Emergency Medicine

## 2021-07-31 ENCOUNTER — Other Ambulatory Visit: Payer: Self-pay

## 2021-07-31 ENCOUNTER — Ambulatory Visit
Admission: EM | Admit: 2021-07-31 | Discharge: 2021-07-31 | Disposition: A | Discharge status: Home / Self Care | Payer: Medicaid Other | Attending: Emergency Medicine | Admitting: Emergency Medicine

## 2021-07-31 ENCOUNTER — Encounter: Payer: Self-pay | Admitting: Emergency Medicine

## 2021-07-31 DIAGNOSIS — R197 Diarrhea, unspecified: Secondary | ICD-10-CM | POA: Insufficient documentation

## 2021-07-31 LAB — GASTROINTESTINAL PANEL BY PCR, STOOL (REPLACES STOOL CULTURE)

## 2021-07-31 MED ORDER — AZITHROMYCIN 250 MG PO TABS: 250.0000 mg | ORAL_TABLET | Freq: Every day | ORAL | 0 refills | Status: DC

## 2021-07-31 MED ORDER — DICYCLOMINE HCL 20 MG PO TABS: 20.0000 mg | ORAL_TABLET | Freq: Two times a day (BID) | ORAL | 0 refills | Status: DC

## 2021-07-31 NOTE — Discharge Instructions (Addendum)
Today we will treat for your irritable bowel syndrome as well as attempt to cover for and check for bacteria ? ?Begin use of azithromycin taking 2 tablets on day 1 and then 4 tablets for the remaining 4 days ? ?Take Bentyl twice a day for the next 10 days to help reduce stomach cramping and slow peristalsis ? ?You may continue use of Imodium ? ?Attempt to eat a bland diet to prevent further stomach irritation and increased gas production ? ?Please attempt to get stool sample and return to clinic for evaluation, please drop off stool sample before 6 PM today or prior to 2 PM on Saturday and Sunday, you will be notified of any concerning results ?

## 2021-07-31 NOTE — ED Triage Notes (Signed)
Patient c/o diarrhea, stomach pain and headache that started over a week ago.  Patient denies fevers.  Patient reports chills.  ?

## 2021-07-31 NOTE — Telephone Encounter (Signed)
Received a call from [place] from [facility] stating that the patient was positive for Campylobacter in her GI Panel done today.  ?

## 2021-07-31 NOTE — ED Provider Notes (Addendum)
?[place] ? ? ? ?CSN: [id number] ?Arrival date & time: 07/31/21  3212 ? ? ?  ? ?History   ?Chief Complaint ?Chief Complaint  ?Patient presents with  ? Diarrhea  ? Headache  ? Abdominal Pain  ? ? ?HPI ?Rose WILLIARD is a 41 y.o. female.  ? ?Patient presents with diarrhea occurring for 2 weeks.  Symptoms are worse in the morning and worse after meals.  Stools described as watery and brown.  Has accompanying generalized abdominal pain described as sharp, aching and cramping radiating at a 5-9 out of 10.  Experiencing nausea intermittently without vomiting.  Has been able to tolerate food and liquids but has not had a decreased appetite over the last 2 days.  Endorses has headaches, chills and body aches without fever.  Denies URI symptoms, heartburn or indigestion, increased bloating, increased gas production, changes in diet, consumption of uncooked meats or recent travel.  History of diabetes, IBS and hypertension. ? ? ? ? ? ?Past Medical History:  ?Diagnosis Date  ? Diabetes mellitus without complication ([place])   ? pre diabetes  ? Hypertension   ? ? ?There are no problems to display for this patient. ? ? ?Past Surgical History:  ?Procedure Laterality Date  ? CESAREAN SECTION    ? times 3  ? ? ?OB History   ?No obstetric history on file. ?  ? ? ? ?Home Medications   ? ?Prior to Admission medications   ?Medication Sig Start Date End Date Taking? Authorizing Provider  ?Dulaglutide (TRULICITY) 2.48 GN/0.0BB SOPN Trulicity 0.48 GQ/9.1 mL subcutaneous pen injector ? ADMINISTER 0.75 MG UNDER THE SKIN EVERY 7 DAYS   Yes [provider]  ?empagliflozin (JARDIANCE) 10 MG TABS tablet Jardiance 10 mg tablet   Yes [provider]  ?losartan (COZAAR) 25 MG tablet Take 1 tablet by mouth daily. 04/28/20 07/31/21 Yes [provider]  ?albuterol (VENTOLIN HFA) 108 (90 Base) MCG/ACT inhaler Inhale 1-2 puffs into the lungs every 6 (six) hours as needed for wheezing or shortness of breath. 11/19/19    Luvenia Redden, PA-C  ?carisoprodol (SOMA) 350 MG tablet Take 1 tablet (350 mg total) by mouth 3 (three) times daily as needed. 06/07/18   Orbie Pyo, MD  ?diphenhydrAMINE (BENADRYL ALLERGY) 25 mg capsule Take 1 capsule (25 mg total) by mouth every 6 (six) hours as needed for up to 5 days. 02/16/18 02/21/18  Lannie Fields, PA-C  ?DULoxetine (CYMBALTA) 30 MG capsule Take 30 mg by mouth daily. 09/01/20   [provider]  ?esomeprazole (NEXIUM) 40 MG capsule Take 40 mg by mouth daily at 12 noon.    [provider]  ?famotidine (PEPCID) 20 MG tablet Take 1 tablet (20 mg total) by mouth 2 (two) times daily for 5 days. 02/16/18 02/21/18  Lannie Fields, PA-C  ?FEROSUL 325 (65 Fe) MG tablet Take 325 mg by mouth every other day. 09/01/20   [provider]  ?meloxicam (MOBIC) 15 MG tablet meloxicam 15 mg tablet ? Take 1 tablet every day by oral route.    [provider]  ?metFORMIN (GLUCOPHAGE) 500 MG tablet Take 500 mg by mouth 2 (two) times daily with a meal.    [provider]  ?mupirocin ointment (BACTROBAN) 2 % Apply three times a day for 5 days. 12/30/15   Marylene Land, NP  ?traMADol (ULTRAM) 50 MG tablet tramadol 50 mg tablet ? Take 1 tablet every 6 hours by oral route.  [provider]  ?VYVANSE 30 MG capsule Take 30 mg by mouth daily as needed. 10/09/20   [provider]  ?losartan-hydrochlorothiazide (HYZAAR) 50-12.5 MG tablet Take 1 tablet by mouth daily.  11/19/19  [provider]  ? ? ?Family History ?History reviewed. No pertinent family history. ? ?Social History ?Social History  ? ?Tobacco Use  ? Smoking status: Every Day  ?  Packs/day: 0.50  ?  Types: Cigarettes  ? Smokeless tobacco: Never  ?Vaping Use  ? Vaping Use: Never used  ?Substance Use Topics  ? Alcohol use: Yes  ?  Comment: occasionally  ? Drug use: No  ? ? ? ?Allergies   ?Patient has no known allergies. ? ? ?Review of Systems ?Review of Systems  ?Constitutional:   Positive for chills. Negative for activity change, appetite change, diaphoresis, fatigue, fever and unexpected weight change.  ?HENT: Negative.    ?Respiratory: Negative.    ?Cardiovascular: Negative.   ?Gastrointestinal:  Positive for abdominal pain, diarrhea and nausea. Negative for abdominal distention, anal bleeding, blood in stool, constipation, rectal pain and vomiting.  ?Skin: Negative.   ?Neurological:  Positive for headaches. Negative for dizziness, tremors, seizures, syncope, facial asymmetry, speech difficulty, weakness, light-headedness and numbness.  ? ? ?Physical Exam ?Triage Vital Signs ?ED Triage Vitals  ?Enc Vitals Group  ?   BP 07/31/21 1040 114/90  ?   Pulse Rate 07/31/21 1040 80  ?   Resp 07/31/21 1040 14  ?   Temp 07/31/21 1040 98.2 ?F (36.8 ?C)  ?   Temp Source 07/31/21 1040 Oral  ?   SpO2 07/31/21 1040 98 %  ?   Weight 07/31/21 1037 260 lb (117.9 kg)  ?   Height 07/31/21 1037 $RemoveBefor'5\' 8"'NUtrMSznvuBT$  (1.727 m)  ?   Head Circumference --   ?   Peak Flow --   ?   Pain Score 07/31/21 1036 4  ?   Pain Loc --   ?   Pain Edu? --   ?   Excl. in [place]? --   ? ?No data found. ? ?Updated Vital Signs ?BP 114/90 (BP Location: Left Arm)   Pulse 80   Temp 98.2 ?F (36.8 ?C) (Oral)   Resp 14   Ht $R'5\' 8"'kf$  (1.727 m)   Wt 260 lb (117.9 kg)   LMP 07/17/2021 (Approximate)   SpO2 98%   BMI 39.53 kg/m?  ? ?Visual Acuity ?Right Eye Distance:   ?Left Eye Distance:   ?Bilateral Distance:   ? ?Right Eye Near:   ?Left Eye Near:    ?Bilateral Near:    ? ?Physical Exam ?Constitutional:   ?   Appearance: She is well-developed.  ?HENT:  ?   Head: Normocephalic.  ?Pulmonary:  ?   Effort: Pulmonary effort is normal.  ?Abdominal:  ?   General: Abdomen is flat. There is no distension.  ?   Palpations: Abdomen is soft.  ?   Tenderness: There is no abdominal tenderness.  ?Skin: ?   General: Skin is warm and dry.  ?Neurological:  ?   General: No focal deficit present.  ?   Mental Status: She is alert and oriented to person, place, and time.   ?Psychiatric:     ?   Mood and Affect: Mood normal.     ?   Behavior: Behavior normal.  ? ? ? ?UC Treatments / Results  ?Labs ?(all labs ordered are listed, but only abnormal results are displayed) ?Labs Reviewed - No data to  display ? ?EKG ? ? ?Radiology ?No results found. ? ?Procedures ?Procedures (including critical care time) ? ?Medications Ordered in UC ?Medications - No data to display ? ?Initial Impression / Assessment and Plan / UC Course  ?I have reviewed the triage vital signs and the nursing notes. ? ?Pertinent labs & imaging results that were available during my care of the patient were reviewed by me and considered in my medical decision making (see chart for details). ? ?Diarrhea ? ?Unable to provide sample in office, stool kit sent home, will complete GI panel if patient able to return sample in the next 1 to 2 days,, will move forward with treatment of IBS and coverage for bacteria due to occurrence of chills, Z-Pak prescribed as well as Bentyl, patient to use Imodium to slow diarrhea which she endorses she has at home, recommended bland diet with increased fluid intake to prevent stomach irritation and dehydration May follow-up with urgent care or PCP for persisting symptoms as needed ?Final Clinical Impressions(s) / UC Diagnoses  ? ?Final diagnoses:  ?None  ? ?Discharge Instructions   ?None ?  ? ?ED Prescriptions   ?None ?  ? ?PDMP not reviewed this encounter. ?  ?Hans Eden, NP ?07/31/21 1235 ? ?  ?Hans Eden, NP ?07/31/21 1236 ? ?

## 2021-10-15 ENCOUNTER — Emergency Department
Admission: EM | Admit: 2021-10-15 | Discharge: 2021-10-16 | Disposition: A | Discharge status: Home / Self Care | Payer: Medicaid Other | Attending: Emergency Medicine | Admitting: Emergency Medicine

## 2021-10-15 ENCOUNTER — Other Ambulatory Visit: Payer: Self-pay

## 2021-10-15 ENCOUNTER — Emergency Department: Payer: Medicaid Other

## 2021-10-15 DIAGNOSIS — M791 Myalgia, unspecified site: Secondary | ICD-10-CM | POA: Diagnosis not present

## 2021-10-15 DIAGNOSIS — R1084 Generalized abdominal pain: Secondary | ICD-10-CM | POA: Insufficient documentation

## 2021-10-15 DIAGNOSIS — R519 Headache, unspecified: Secondary | ICD-10-CM | POA: Insufficient documentation

## 2021-10-15 DIAGNOSIS — N2889 Other specified disorders of kidney and ureter: Secondary | ICD-10-CM

## 2021-10-15 DIAGNOSIS — R1031 Right lower quadrant pain: Secondary | ICD-10-CM

## 2021-10-15 DIAGNOSIS — R300 Dysuria: Secondary | ICD-10-CM | POA: Diagnosis not present

## 2021-10-15 DIAGNOSIS — H9209 Otalgia, unspecified ear: Secondary | ICD-10-CM | POA: Insufficient documentation

## 2021-10-15 DIAGNOSIS — N898 Other specified noninflammatory disorders of vagina: Secondary | ICD-10-CM | POA: Diagnosis not present

## 2021-10-15 DIAGNOSIS — R000 Tachycardia, unspecified: Secondary | ICD-10-CM | POA: Insufficient documentation

## 2021-10-15 DIAGNOSIS — Z20822 Contact with and (suspected) exposure to covid-19: Secondary | ICD-10-CM | POA: Diagnosis not present

## 2021-10-15 DIAGNOSIS — R509 Fever, unspecified: Secondary | ICD-10-CM

## 2021-10-15 LAB — COMPREHENSIVE METABOLIC PANEL
ALT: 16 U/L (ref 0–44)
AST: 17 U/L (ref 15–41)
Albumin: 3.8 g/dL (ref 3.5–5.0)
Alkaline Phosphatase: 71 U/L (ref 38–126)
Anion gap: 10 (ref 5–15)
BUN: 11 mg/dL (ref 6–20)
CO2: 26 mmol/L (ref 22–32)
Calcium: 8.7 mg/dL — ABNORMAL LOW (ref 8.9–10.3)
Chloride: 102 mmol/L (ref 98–111)
Creatinine, Ser: 0.83 mg/dL (ref 0.44–1.00)
GFR, Estimated: >60 mL/min (ref >60)
Glucose, Bld: 232 mg/dL — ABNORMAL HIGH (ref 70–99)
Potassium: 3.9 mmol/L (ref 3.5–5.1)
Sodium: 138 mmol/L (ref 135–145)
Total Bilirubin: 0.6 mg/dL (ref 0.3–1.2)
Total Protein: 7 g/dL (ref 6.5–8.1)

## 2021-10-15 LAB — WET PREP, GENITAL
Clue Cells Wet Prep HPF POC: NONE SEEN
Sperm: NONE SEEN
Trich, Wet Prep: NONE SEEN
WBC, Wet Prep HPF POC: NONE SEEN — AB (ref <10)
Yeast Wet Prep HPF POC: NONE SEEN

## 2021-10-15 LAB — CBC
HCT: 34.7 % — ABNORMAL LOW (ref 36.0–46.0)
Hemoglobin: 9.9 g/dL — ABNORMAL LOW (ref 12.0–15.0)
MCH: 19.4 pg — ABNORMAL LOW (ref 26.0–34.0)
MCHC: 28.5 g/dL — ABNORMAL LOW (ref 30.0–36.0)
MCV: 68.2 fL — ABNORMAL LOW (ref 80.0–100.0)
Platelets: 352 10*3/uL (ref 150–400)
RBC: 5.09 MIL/uL (ref 3.87–5.11)
RDW: 17.9 % — ABNORMAL HIGH (ref 11.5–15.5)
WBC: 6.1 10*3/uL (ref 4.0–10.5)
nRBC: 0 % (ref 0.0–0.2)

## 2021-10-15 LAB — URINALYSIS, ROUTINE W REFLEX MICROSCOPIC
Bacteria, UA: NONE SEEN
Bilirubin Urine: NEGATIVE
Glucose, UA: >=500 mg/dL — AB
Hgb urine dipstick: NEGATIVE
Ketones, ur: NEGATIVE mg/dL
Leukocytes,Ua: NEGATIVE
Nitrite: NEGATIVE
Protein, ur: NEGATIVE mg/dL
Specific Gravity, Urine: 1.025 (ref 1.005–1.030)
pH: 5 (ref 5.0–8.0)

## 2021-10-15 LAB — LACTIC ACID, PLASMA: Lactic Acid, Venous: 1.6 mmol/L (ref 0.5–1.9)

## 2021-10-15 LAB — POC URINE PREG, ED: Preg Test, Ur: NEGATIVE

## 2021-10-15 LAB — LIPASE, BLOOD: Lipase: 28 U/L (ref 11–51)

## 2021-10-15 LAB — RESP PANEL BY RT-PCR (FLU A&B, COVID) ARPGX2
Influenza A by PCR: NEGATIVE
Influenza B by PCR: NEGATIVE
SARS Coronavirus 2 by RT PCR: NEGATIVE

## 2021-10-15 MED ORDER — IOHEXOL 300 MG/ML  SOLN
100.0000 mL | Freq: Once | INTRAMUSCULAR | Status: AC | PRN
  Administered 2021-10-15: 100 mL via INTRAVENOUS

## 2021-10-15 MED ORDER — ACETAMINOPHEN 500 MG PO TABS
1000.0000 mg | ORAL_TABLET | Freq: Once | ORAL | Status: AC
  Administered 2021-10-15: 1000 mg via ORAL
  Filled 2021-10-15: qty 2

## 2021-10-15 MED ORDER — ACETAMINOPHEN 325 MG PO TABS: 650.0000 mg | ORAL_TABLET | Freq: Once | ORAL | Status: DC

## 2021-10-15 NOTE — ED Notes (Signed)
Patient updated on plan of care

## 2021-10-15 NOTE — ED Provider Notes (Signed)
[facility] Provider Note    Event Date/Time   First MD Initiated Contact with Patient 10/15/21 2212     (approximate)   History   Abdominal Pain and Generalized Body Aches   HPI  Rose Tate is a 41 y.o. female  who presents to the emergency department today with concerns for myriad medical complaints.  She states that they all started roughly 4 to 5 days ago.  She did notice some discomfort in her right jaw which is now progressed into her right ear.  She has not noticed any significant pain with biting or chewing.  She additionally has had complaints of headache.  She also has complaints of dysuria.  She has had some lower abdominal discomfort with this.  She tried taking azithromycin that she had at home.  Also has noticed some abnormal vaginal discharge.  She denies any recent sexual activity.  She has had generalized body aches. She also has had chills.   Physical Exam   Triage Vital Signs: ED Triage Vitals  Enc Vitals Group     BP 10/15/21 2047 (!) 146/90     Pulse Rate 10/15/21 2047 (!) 110     Resp 10/15/21 2047 20     Temp 10/15/21 2047 (!) 100.7 F (38.2 C)     Temp Source 10/15/21 2047 Oral     SpO2 10/15/21 2047 95 %     Weight 10/15/21 2048 250 lb (113.4 kg)     Height 10/15/21 2048 5\' 8"  (1.727 m)     Head Circumference --      Peak Flow --      Pain Score 10/15/21 2048 6   Most recent vital signs: Vitals:   10/15/21 2047  BP: (!) 146/90  Pulse: (!) 110  Resp: 20  Temp: (!) 100.7 F (38.2 C)  SpO2: 95%    General: Awake, alert, oriented.  CV:  Good peripheral perfusion. Regular rhythm, tachycardia. Resp:  Normal effort. Lungs clear Abd:  No distention. Tender to palpation diffusely, worse in RLQ. ENT:  TMs without bulging, erythema or fluid.    ED Results / Procedures / Treatments   Labs (all labs ordered are listed, but only abnormal results are displayed) Labs Reviewed  URINALYSIS, ROUTINE W REFLEX MICROSCOPIC  - Abnormal; Notable for the following components:      Result Value   Color, Urine YELLOW (*)    APPearance CLEAR (*)    Glucose, UA >=500 (*)    All other components within normal limits  CBC - Abnormal; Notable for the following components:   Hemoglobin 9.9 (*)    HCT 34.7 (*)    MCV 68.2 (*)    MCH 19.4 (*)    MCHC 28.5 (*)    RDW 17.9 (*)    All other components within normal limits  COMPREHENSIVE METABOLIC PANEL - Abnormal; Notable for the following components:   Glucose, Bld 232 (*)    Calcium 8.7 (*)    All other components within normal limits  POC URINE PREG, ED - Normal  WET PREP, GENITAL  RESP PANEL BY RT-PCR (FLU A&B, COVID) ARPGX2  LIPASE, BLOOD  LACTIC ACID, PLASMA  LACTIC ACID, PLASMA     EKG  None   RADIOLOGY CT pending at time of sign out    PROCEDURES:  Critical Care performed: No  Procedures   MEDICATIONS ORDERED IN ED: Medications  acetaminophen (TYLENOL) tablet 1,000 mg (1,000 mg Oral Given 10/15/21 2057)  iohexol (OMNIPAQUE) 300 MG/ML solution 100 mL (100 mLs Intravenous Contrast Given 10/15/21 2256)     IMPRESSION / MDM / ASSESSMENT AND PLAN / ED COURSE  I reviewed the triage vital signs and the nursing notes.                              Differential diagnosis includes, but is not limited to, viral illness, appendicitis, UTI.  Patient presented to the emergency department today with myriad medical complaints.  On exam patient was febrile slightly tachycardic and was tender in the right lower quadrant.  TMs without concerning findings.  Given concern for possible appendicitis CT scan was obtained.  Urine without findings concerning for urinary tract infection.  Will send a wet prep.  Will send COVID and flu.    FINAL CLINICAL IMPRESSION(S) / ED DIAGNOSES   Ear ache Stomach pain Fever   Note:  This document was prepared using Dragon voice recognition software and may include unintentional dictation errors.    Nance Pear, MD 10/15/21 [phone]

## 2021-10-15 NOTE — ED Triage Notes (Signed)
Pt presents to ER c/o abd pain, generalized body aches, and burning with urination since Monday.  Pt states she has had frequent UTI's in past, but has not had any in a while.  Pt states abd pain is all over.  Endorses burning with urination along with nausea and diarrhea.  Pt also states she is having some chills and body aches.  States she took leftover z-pack at home for symptoms.  Pt A&O x4 at this time in NAD.

## 2021-10-16 NOTE — Discharge Instructions (Signed)
As we discussed, your CT showed a 2.8 cm lesion in the right kidney there raises concern for possible cancer.  Make sure to call your doctor first thing in the morning to schedule an appointment for an outpatient MRI and further guidance on investigating this mass.  For your fever make sure to drink plenty of fluids, take Tylenol or ibuprofen for your symptoms.  Close follow-up with PCP.  Return to the hospital for any concerning findings including trouble breathing, chest pain, new or worsening abdominal pain.

## 2021-10-16 NOTE — ED Provider Notes (Signed)
Accepted care of this patient from Dr. Archie Balboa at 11:30 PM, pending results of respiratory panel and CT.  Respiratory panel is negative for COVID and flu.  However the CT is concerning for a 2.8 cm lesion in the posterior right lower kidney that according to radiology raises concern for possible neoplasm.  The CT was compared to another one from 2020.  There is no other concerning features.  Her symptoms seem to be most likely a viral syndrome with a low-grade fever and nonspecific symptoms.  This finding of the CT was discussed with patient who does have a primary care doctor.  I discussed the recommendation of an outpatient MRI and close follow-up with her primary care doctor for further guidance on investigating this mass.  I discussed my standard return precautions.    I, Rudene Re, attending MD, have personally viewed and interpreted the images obtained during this visit as below:  CT showing a right kidney mass   ___________________________________________________ Interpretation by Radiologist:  CT ABDOMEN PELVIS W CONTRAST  Result Date: 10/15/2021 CLINICAL DATA:  Right lower quadrant abdominal pain, body aches, dysuria history of frequent UTIs EXAM: CT ABDOMEN AND PELVIS WITH CONTRAST TECHNIQUE: Multidetector CT imaging of the abdomen and pelvis was performed using the standard protocol following bolus administration of intravenous contrast. RADIATION DOSE REDUCTION: This exam was performed according to the departmental dose-optimization program which includes automated exposure control, adjustment of the mA and/or kV according to patient size and/or use of iterative reconstruction technique. CONTRAST:  115mL OMNIPAQUE IOHEXOL 300 MG/ML  SOLN COMPARISON:  06/07/2018 FINDINGS: Lower chest: Lung bases are clear. Hepatobiliary: Liver is within normal limits. Gallbladder is unremarkable. No intrahepatic or extrahepatic duct dilatation. Pancreas: Within normal limits. Spleen: Within normal  limits. Adrenals/Urinary Tract: Adrenal glands are within normal limits. 2.8 cm lesion in the posterior right lower kidney (series 2/image 47) which is new from the prior and does not have the appearance of a simple cyst, raising concern for a solid renal neoplasm. Left kidney is within normal limits.  No hydronephrosis. Bladder is mildly thick-walled although underdistended. Stomach/Bowel: Stomach is within normal limits. No evidence of bowel obstruction. Normal appendix (series 2/image 77). No colonic wall thickening or inflammatory changes. Vascular/Lymphatic: No evidence of abdominal aortic aneurysm. No suspicious abdominopelvic lymphadenopathy. Reproductive: Uterus is within normal limits. Bilateral ovaries are within normal limits. Other: No abdominopelvic ascites. Musculoskeletal: Visualized osseous structures are within normal limits. IMPRESSION: No evidence of bowel obstruction. Normal appendix. No CT findings to account for the patient's right lower quadrant abdominal pain. 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 Outpatient urology referral is suggested. Consider follow-up MRI abdomen with/without contrast in 4-6 weeks. Electronically Signed   By: Julian Hy M.D.   On: 10/15/2021 23:18       Rudene Re, MD 10/16/21 [phone]

## 2022-03-18 HISTORY — PX: KIDNEY SURGERY: SHX687

## 2022-03-22 ENCOUNTER — Ambulatory Visit: Payer: Medicaid Other | Admitting: Cardiology

## 2022-03-24 ENCOUNTER — Emergency Department: Payer: Medicaid Other

## 2022-03-24 ENCOUNTER — Other Ambulatory Visit: Payer: Self-pay

## 2022-03-24 ENCOUNTER — Encounter: Payer: Self-pay | Admitting: Emergency Medicine

## 2022-03-24 ENCOUNTER — Emergency Department
Admission: EM | Admit: 2022-03-24 | Discharge: 2022-03-24 | Disposition: A | Discharge status: Home / Self Care | Payer: Medicaid Other | Attending: Student in an Organized Health Care Education/Training Program | Admitting: Student in an Organized Health Care Education/Training Program

## 2022-03-24 DIAGNOSIS — R109 Unspecified abdominal pain: Secondary | ICD-10-CM

## 2022-03-24 DIAGNOSIS — R1031 Right lower quadrant pain: Secondary | ICD-10-CM | POA: Insufficient documentation

## 2022-03-24 LAB — CBC WITH DIFFERENTIAL/PLATELET
Abs Immature Granulocytes: 0.04 10*3/uL (ref 0.00–0.07)
Basophils Absolute: 0.1 10*3/uL (ref 0.0–0.1)
Basophils Relative: 1 %
Eosinophils Absolute: 0.3 10*3/uL (ref 0.0–0.5)
Eosinophils Relative: 3 %
HCT: 36.9 % (ref 36.0–46.0)
Hemoglobin: 11 g/dL — ABNORMAL LOW (ref 12.0–15.0)
Immature Granulocytes: 0 %
Lymphocytes Relative: 24 %
Lymphs Abs: 2.3 10*3/uL (ref 0.7–4.0)
MCH: 19.6 pg — ABNORMAL LOW (ref 26.0–34.0)
MCHC: 29.8 g/dL — ABNORMAL LOW (ref 30.0–36.0)
MCV: 65.8 fL — ABNORMAL LOW (ref 80.0–100.0)
Monocytes Absolute: 0.8 10*3/uL (ref 0.1–1.0)
Monocytes Relative: 8 %
Neutro Abs: 6 10*3/uL (ref 1.7–7.7)
Neutrophils Relative %: 64 %
Platelets: 378 10*3/uL (ref 150–400)
RBC: 5.61 MIL/uL — ABNORMAL HIGH (ref 3.87–5.11)
RDW: 21.2 % — ABNORMAL HIGH (ref 11.5–15.5)
Smear Review: NORMAL
WBC: 9.4 10*3/uL (ref 4.0–10.5)
nRBC: 0 % (ref 0.0–0.2)

## 2022-03-24 LAB — COMPREHENSIVE METABOLIC PANEL
ALT: 16 U/L (ref 0–44)
AST: 12 U/L — ABNORMAL LOW (ref 15–41)
Albumin: 3.9 g/dL (ref 3.5–5.0)
Alkaline Phosphatase: 65 U/L (ref 38–126)
Anion gap: 7 (ref 5–15)
BUN: 11 mg/dL (ref 6–20)
CO2: 21 mmol/L — ABNORMAL LOW (ref 22–32)
Calcium: 8.8 mg/dL — ABNORMAL LOW (ref 8.9–10.3)
Chloride: 109 mmol/L (ref 98–111)
Creatinine, Ser: 0.68 mg/dL (ref 0.44–1.00)
GFR, Estimated: >60 mL/min (ref >60)
Glucose, Bld: 176 mg/dL — ABNORMAL HIGH (ref 70–99)
Potassium: 4 mmol/L (ref 3.5–5.1)
Sodium: 137 mmol/L (ref 135–145)
Total Bilirubin: 0.9 mg/dL (ref 0.3–1.2)
Total Protein: 7.1 g/dL (ref 6.5–8.1)

## 2022-03-24 LAB — URINALYSIS, ROUTINE W REFLEX MICROSCOPIC
Bacteria, UA: NONE SEEN
Bilirubin Urine: NEGATIVE
Glucose, UA: >=500 mg/dL — AB
Ketones, ur: NEGATIVE mg/dL
Leukocytes,Ua: NEGATIVE
Nitrite: NEGATIVE
Protein, ur: NEGATIVE mg/dL
Specific Gravity, Urine: 1.031 — ABNORMAL HIGH (ref 1.005–1.030)
pH: 6 (ref 5.0–8.0)

## 2022-03-24 LAB — POC URINE PREG, ED: Preg Test, Ur: NEGATIVE

## 2022-03-24 LAB — LIPASE, BLOOD: Lipase: 35 U/L (ref 11–51)

## 2022-03-24 MED ORDER — ONDANSETRON HCL 4 MG/2ML IJ SOLN
4.0000 mg | Freq: Once | INTRAMUSCULAR | Status: AC
  Administered 2022-03-24: 4 mg via INTRAVENOUS
  Filled 2022-03-24: qty 2

## 2022-03-24 MED ORDER — SODIUM CHLORIDE 0.9 % IV BOLUS
500.0000 mL | Freq: Once | INTRAVENOUS | Status: AC
  Administered 2022-03-24: 500 mL via INTRAVENOUS

## 2022-03-24 MED ORDER — IOHEXOL 300 MG/ML  SOLN
100.0000 mL | Freq: Once | INTRAMUSCULAR | Status: AC | PRN
  Administered 2022-03-24: 100 mL via INTRAVENOUS

## 2022-03-24 MED ORDER — OXYCODONE-ACETAMINOPHEN 5-325 MG PO TABS: 1.0000 | ORAL_TABLET | ORAL | 0 refills | Status: DC | PRN

## 2022-03-24 MED ORDER — MORPHINE SULFATE (PF) 4 MG/ML IV SOLN
4.0000 mg | INTRAVENOUS | Status: DC | PRN
  Administered 2022-03-24: 4 mg via INTRAVENOUS
  Filled 2022-03-24: qty 1

## 2022-03-24 NOTE — ED Provider Notes (Signed)
[facility] Provider Note    Event Date/Time   First MD Initiated Contact with Patient 03/24/22 [phone]     (approximate)   History   Flank Pain   HPI  Rose Tate is a 41 y.o. female status post recent cryoablation for patient being a renal mass presents to the ER for evaluation of right flank right lower quadrant abdominal pain GERD last night while she was at work.  Felt sudden moderate to severe pain.  Worse with movement.  Does hurt to breathe.  No dysuria has had some increased urine frequency no hematuria.  No measured temperature.  No nausea or vomiting.     Physical Exam   Triage Vital Signs: ED Triage Vitals [03/24/22 3151]  Enc Vitals Group     BP (!) 133/95     Pulse Rate 100     Resp 16     Temp 98.1 F (36.7 C)     Temp Source Oral     SpO2 97 %     Weight 250 lb (113.4 kg)     Height 5\' 8"  (1.727 m)     Head Circumference      Peak Flow      Pain Score 6     Pain Loc      Pain Edu?      Excl. in [place]?     Most recent vital signs: Vitals:   03/24/22 1045 03/24/22 1100  BP:  117/86  Pulse: 81 70  Resp:  18  Temp:    SpO2: 97% 96%     Constitutional: Alert  Eyes: Conjunctivae are normal.  Head: Atraumatic. Nose: No congestion/rhinnorhea. Mouth/Throat: Mucous membranes are moist.   Neck: Painless ROM.  Cardiovascular:   Good peripheral circulation. Respiratory: Normal respiratory effort.  No retractions.  Gastrointestinal: Soft and nontender.  Musculoskeletal:  no deformity Neurologic:  MAE spontaneously. No gross focal neurologic deficits are appreciated.  Skin:  Skin is warm, dry and intact. No rash noted.  Injection site appears clean dry and intact no fluctuance no overlying erythema. Psychiatric: Mood and affect are normal. Speech and behavior are normal.    ED Results / Procedures / Treatments   Labs (all labs ordered are listed, but only abnormal results are displayed) Labs Reviewed  CBC WITH  DIFFERENTIAL/PLATELET - Abnormal; Notable for the following components:      Result Value   RBC 5.61 (*)    Hemoglobin 11.0 (*)    MCV 65.8 (*)    MCH 19.6 (*)    MCHC 29.8 (*)    RDW 21.2 (*)    All other components within normal limits  COMPREHENSIVE METABOLIC PANEL - Abnormal; Notable for the following components:   CO2 21 (*)    Glucose, Bld 176 (*)    Calcium 8.8 (*)    AST 12 (*)    All other components within normal limits  URINALYSIS, ROUTINE W REFLEX MICROSCOPIC - Abnormal; Notable for the following components:   Color, Urine YELLOW (*)    APPearance HAZY (*)    Specific Gravity, Urine 1.031 (*)    Glucose, UA >=500 (*)    Hgb urine dipstick LARGE (*)    All other components within normal limits  LIPASE, BLOOD  POC URINE PREG, ED     EKG     RADIOLOGY Please see ED Course for my review and interpretation.  I personally reviewed all radiographic images ordered to evaluate for the above acute  complaints and reviewed radiology reports and findings.  These findings were personally discussed with the patient.  Please see medical record for radiology report.    PROCEDURES:  Critical Care performed: No  Procedures   MEDICATIONS ORDERED IN ED: Medications  morphine (PF) 4 MG/ML injection 4 mg (4 mg Intravenous Given 03/24/22 0735)  ondansetron (ZOFRAN) injection 4 mg (4 mg Intravenous Given 03/24/22 0734)  sodium chloride 0.9 % bolus 500 mL (0 mLs Intravenous Stopped 03/24/22 0900)  iohexol (OMNIPAQUE) 300 MG/ML solution 100 mL (100 mLs Intravenous Contrast Given 03/24/22 0925)     IMPRESSION / MDM / ASSESSMENT AND PLAN / ED COURSE  I reviewed the triage vital signs and the nursing notes.                              Differential diagnosis includes, but is not limited to, muscle strain, appendicitis, colitis, biliary pathology, pneumothorax, effusion, pneumonia, stone  Patient presenting to the ER for evaluation of symptoms as described above.  Based on  symptoms, risk factors and considered above differential, this presenting complaint could reflect a potentially life-threatening illness therefore the patient will be placed on continuous pulse oximetry and telemetry for monitoring.  Laboratory evaluation will be sent to evaluate for the above complaints.  Based on her acute onset pain will order imaging.  Will provide IV morphine as well as fluids.  Will check blood work.  She is low risk by Wells criteria is PERC negative.  Have a low suspicion for PE.  Does not seem consistent with dissection.   Clinical Course as of 03/24/22 1143  Wed Mar 24, 2022  0829 X-ray on my review and interpretation does not show any evidence of pneumothorax. [PR]  [phone] Imaging consistent with small adjacent hematoma she is not on any blood thinners.  No sign of active extravasation.  Findings likely secondary to recent cryoablation.  But based on her recent procedure did discuss case in consultation with IR who has recommended ultrasound renal and Doppler to evaluate for possible pseudoaneurysm.  Findings otherwise reassuring.  Chest x-ray likely reflecting atelectasis not consistent with pneumonia she is not hypoxic no white count . does appear stable and appropriate for outpatient management.  Discussed return precautions. [PR]    Clinical Course User Index [PR] Willy Eddy, MD    FINAL CLINICAL IMPRESSION(S) / ED DIAGNOSES   Final diagnoses:  Right flank pain     Rx / DC Orders   ED Discharge Orders          Ordered    oxyCODONE-acetaminophen (PERCOCET) 5-325 MG tablet  Every 4 hours PRN        03/24/22 0950             Note:  This document was prepared using Dragon voice recognition software and may include unintentional dictation errors.    Willy Eddy, MD 03/24/22 1143

## 2022-03-24 NOTE — ED Notes (Signed)
Pt to xray

## 2022-03-24 NOTE — Discharge Instructions (Signed)

## 2022-03-24 NOTE — ED Triage Notes (Signed)
Right renal mass cryoablation on Thursday.  STates last night while at work was reaching up to get something with right arm, felt a burning sensation to right flank and back radiating upward toward chest.  Now c/o burning to aching, sharp stabbing pain to right flank and RLQ.

## 2022-04-16 ENCOUNTER — Encounter: Payer: Self-pay | Admitting: Emergency Medicine

## 2022-04-16 ENCOUNTER — Ambulatory Visit
Admission: EM | Admit: 2022-04-16 | Discharge: 2022-04-16 | Disposition: A | Discharge status: Home / Self Care | Payer: Medicaid Other | Attending: Emergency Medicine | Admitting: Emergency Medicine

## 2022-04-16 DIAGNOSIS — J0140 Acute pansinusitis, unspecified: Secondary | ICD-10-CM

## 2022-04-16 MED ORDER — DOXYCYCLINE HYCLATE 100 MG PO CAPS: 100.0000 mg | ORAL_CAPSULE | Freq: Two times a day (BID) | ORAL | 0 refills | Status: AC

## 2022-04-16 MED ORDER — IBUPROFEN 600 MG PO TABS: 600.0000 mg | ORAL_TABLET | Freq: Four times a day (QID) | ORAL | 0 refills | Status: DC | PRN

## 2022-04-16 MED ORDER — FLUTICASONE PROPIONATE 50 MCG/ACT NA SUSP: 2.0000 | Freq: Every day | NASAL | 0 refills | Status: DC

## 2022-04-16 NOTE — ED Provider Notes (Signed)
HPI  SUBJECTIVE:  Rose Tate is a 41 y.o. female who presents with upper respiratory symptoms for 2 weeks which are getting worse.  She reports green rhinorrhea, sinus pain and pressure, nasal congestion, occasional bilateral sharp ear pain, dizzy spells accompanied with nausea, upper dental pain, postnasal drip, and mild cough.  No facial swelling, wheezing, shortness of breath.  She had a sore throat, but this has resolved.  No antibiotics in the past month.  No antipyretic in the past 6 hours.  She has taken DayQuil with improvement in her symptoms.  Symptoms are worse with bending forward.  She has a past medical history of diabetes, hypertension.  LMP: She has been spotting for the past month.  [facility]P: [facility]    Past Medical History:  Diagnosis Date   Diabetes mellitus without complication (HCC)    pre diabetes   Hypertension     Past Surgical History:  Procedure Laterality Date   CESAREAN SECTION     times 3   KIDNEY SURGERY  03/18/2022    History reviewed. No pertinent family history.  Social History   Tobacco Use   Smoking status: Every Day    Packs/day: 0.50    Types: Cigarettes   Smokeless tobacco: Never  Vaping Use   Vaping Use: Never used  Substance Use Topics   Alcohol use: Yes    Comment: occasionally   Drug use: No    No current facility-administered medications for this encounter.  Current Outpatient Medications:    doxycycline (VIBRAMYCIN) 100 MG capsule, Take 1 capsule (100 mg total) by mouth 2 (two) times daily for 10 days., Disp: 20 capsule, Rfl: 0   Dulaglutide (TRULICITY) 0.75 MG/0.5ML SOPN, Trulicity 0.75 mg/0.5 mL subcutaneous pen injector  ADMINISTER 0.75 MG UNDER THE SKIN EVERY 7 DAYS, Disp: , Rfl:    empagliflozin (JARDIANCE) 10 MG TABS tablet, Jardiance 10 mg tablet, Disp: , Rfl:    fluticasone (FLONASE) 50 MCG/ACT nasal spray, Place 2 sprays into both nostrils daily., Disp: 16 g, Rfl: 0   ibuprofen (ADVIL) 600 MG tablet,  Take 1 tablet (600 mg total) by mouth every 6 (six) hours as needed., Disp: 30 tablet, Rfl: 0   losartan (COZAAR) 25 MG tablet, Take 1 tablet by mouth daily., Disp: , Rfl:    albuterol (VENTOLIN HFA) 108 (90 Base) MCG/ACT inhaler, Inhale 1-2 puffs into the lungs every 6 (six) hours as needed for wheezing or shortness of breath., Disp: 8 g, Rfl: 0   carisoprodol (SOMA) 350 MG tablet, Take 1 tablet (350 mg total) by mouth 3 (three) times daily as needed., Disp: 15 tablet, Rfl: 0   dicyclomine (BENTYL) 20 MG tablet, Take 1 tablet (20 mg total) by mouth 2 (two) times daily., Disp: 20 tablet, Rfl: 0   DULoxetine (CYMBALTA) 30 MG capsule, Take 30 mg by mouth daily., Disp: , Rfl:    esomeprazole (NEXIUM) 40 MG capsule, Take 40 mg by mouth daily at 12 noon., Disp: , Rfl:    famotidine (PEPCID) 20 MG tablet, Take 1 tablet (20 mg total) by mouth 2 (two) times daily for 5 days., Disp: 30 tablet, Rfl: 0   FEROSUL 325 (65 Fe) MG tablet, Take 325 mg by mouth every other day., Disp: , Rfl:    metFORMIN (GLUCOPHAGE) 500 MG tablet, Take 500 mg by mouth 2 (two) times daily with a meal., Disp: , Rfl:    mupirocin ointment (BACTROBAN) 2 %, Apply three times a day for 5 days.,  Disp: 22 g, Rfl: 0   oxyCODONE-acetaminophen (PERCOCET) 5-325 MG tablet, Take 1 tablet by mouth every 4 (four) hours as needed for severe pain., Disp: 8 tablet, Rfl: 0  Allergies  Allergen Reactions   Augmentin [Amoxicillin-Pot Clavulanate] Nausea And Vomiting     ROS  As noted in HPI.   Physical Exam  BP 137/84 (BP Location: Left Arm)   Pulse 92   Temp 97.6 F (36.4 C) (Oral)   Resp 15   Ht 5\' 8"  (1.727 m)   Wt 111.1 kg   LMP 03/25/2022 (Approximate)   SpO2 100%   BMI 37.25 kg/m   Constitutional: Well developed, well nourished, no acute distress Eyes:  EOMI, conjunctiva normal bilaterally HENT: Normocephalic, atraumatic,mucus membranes moist.  Erythematous, swollen turbinates.  Positive purulent nasal drainage.  No  maxillary, frontal sinus tenderness.  Normal tonsils without exudates.  Uvula midline.  Positive postnasal drip.  TMs normal bilaterally. Respiratory: Normal inspiratory effort Cardiovascular: Normal rate GI: nondistended skin: No rash, skin intact Musculoskeletal: no deformities Neurologic: Alert & oriented x 3, no focal neuro deficits Psychiatric: Speech and behavior appropriate   ED Course   Medications - No data to display  No orders of the defined types were placed in this encounter.   No results found for this or any previous visit (from the past 24 hour(s)). No results found.  ED Clinical Impression  1. Acute non-recurrent pansinusitis      ED Assessment/Plan     Patient with acute pansinusitis.  She qualifies for antibiotics given duration of symptoms.  Home with 10 days of doxycycline, saline nasal irrigation, Mucinex, Tylenol/ibuprofen, Flonase.  Discontinue other cold medications.  Discussed MDM, treatment plan, and plan for follow-up with patient.  patient agrees with plan.   Meds ordered this encounter  Medications   doxycycline (VIBRAMYCIN) 100 MG capsule    Sig: Take 1 capsule (100 mg total) by mouth 2 (two) times daily for 10 days.    Dispense:  20 capsule    Refill:  0   fluticasone (FLONASE) 50 MCG/ACT nasal spray    Sig: Place 2 sprays into both nostrils daily.    Dispense:  16 g    Refill:  0   ibuprofen (ADVIL) 600 MG tablet    Sig: Take 1 tablet (600 mg total) by mouth every 6 (six) hours as needed.    Dispense:  30 tablet    Refill:  0      *This clinic note was created using 03/27/2022. Therefore, there may be occasional mistakes despite careful proofreading.  ?    Scientist, clinical (histocompatibility and immunogenetics), MD 04/17/22 [phone]

## 2022-04-16 NOTE — Discharge Instructions (Signed)
Finish the doxycycline even if you feel better, Mucinex, 1000 mg of Tylenol combined with 600 mg of ibuprofen 3 times a day, Flonase.  Use a NeilMed sinus rinse with distilled water as often as you want to to reduce nasal congestion. Follow the directions on the box.  Stop other cold medications.  Go to www.goodrx.com to look up your medications. This will give you a list of where you can find your prescriptions at the most affordable prices. Or you can ask the pharmacist what the cash price is. This is frequently cheaper than going through insurance.

## 2022-04-16 NOTE — ED Triage Notes (Signed)
Patient c/o heaches, sinus pain and pressure that started 2 weeks ago.  Patient denies fevers.

## 2022-06-05 ENCOUNTER — Emergency Department
Admission: EM | Admit: 2022-06-05 | Discharge: 2022-06-06 | Disposition: A | Discharge status: Home / Self Care | Payer: Medicaid Other | Attending: Emergency Medicine | Admitting: Emergency Medicine

## 2022-06-05 ENCOUNTER — Other Ambulatory Visit: Payer: Self-pay

## 2022-06-05 DIAGNOSIS — H539 Unspecified visual disturbance: Secondary | ICD-10-CM | POA: Insufficient documentation

## 2022-06-05 DIAGNOSIS — I10 Essential (primary) hypertension: Secondary | ICD-10-CM | POA: Diagnosis not present

## 2022-06-05 DIAGNOSIS — E119 Type 2 diabetes mellitus without complications: Secondary | ICD-10-CM | POA: Diagnosis not present

## 2022-06-05 NOTE — ED Triage Notes (Signed)
Pt arrives via POV with CC of vision changes in R eye. Pt reports having episode of blurred vision and seeing flashing colors "like looking through a kaleidoscope". Pt reports currently seeing "black floaters".

## 2022-06-05 NOTE — Discharge Instructions (Addendum)
Your exam is reassuring at this time. There is no evidence of a retinal detachment. You should avoid heavy lifting or straining, and no bending of the head below your waist. Follow-up with Dr. Lazarus Salines as discussed. Return to the ED for new floaters, eye pain, or vision loss like a curtain or veil coming over the visual field.

## 2022-06-05 NOTE — ED Provider Notes (Signed)
[facility] Emergency Department Provider Note     Event Date/Time   First MD Initiated Contact with Patient 06/05/22 2136     (approximate)   History   Eye Problem   HPI  Rose Tate is a 42 y.o. female with a history of DM and HTN, presents to the ED with complaints of vision change in the right eye, primarily. She notes onset of symptoms yesterday, while at work, approximately 6 pm.  Patient reports episodes of intermittent blurry vision and seeing flashing colors, lasting about 30 minutes.  She describes the vision disturbance as though she is "looking through a kaleidoscope."  Patient notes vision changes completely resolved.  Patient denies any recent injury, trauma, or headache.  She reports seeing black floaters in her visual fields at this time, intermittently since 11 am today. She notes her eye now feels itchy.     Physical Exam   Triage Vital Signs: ED Triage Vitals  Enc Vitals Group     BP 06/05/22 2104 (!) 155/95     Pulse Rate 06/05/22 2104 80     Resp 06/05/22 2104 19     Temp 06/05/22 2104 98.9 F (37.2 C)     Temp Source 06/05/22 2104 Oral     SpO2 06/05/22 2104 96 %     Weight 06/05/22 2105 245 lb (111.1 kg)     Height 06/05/22 2105 5\' 8"  (1.727 m)     Head Circumference --      Peak Flow --      Pain Score 06/05/22 2104 4     Pain Loc --      Pain Edu? --      Excl. in [place]? --     Most recent vital signs: Vitals:   06/05/22 2104  BP: (!) 155/95  Pulse: 80  Resp: 19  Temp: 98.9 F (37.2 C)  SpO2: 96%    General Awake, no distress. NAD HEENT NCAT. PERRL. EOMI. Normal gross eye exam.  No hyphema or subconjunctival hemorrhage appreciated.  Normal fundi bilaterally. No papilledema, vitreous floaters, sharp disc and no AV nicking. No rhinorrhea. Mucous membranes are moist.  CV:  Good peripheral perfusion.  RESP:  Normal effort.  ABD:  No distention.    ED Results / Procedures / Treatments   Labs (all labs  ordered are listed, but only abnormal results are displayed) Labs Reviewed - No data to display   EKG   RADIOLOGY  No results found.   PROCEDURES:  Critical Care performed: No  Procedures VA 20/40 OD   20/40 OD uncorrected IOP 2 mmHg OD  MEDICATIONS ORDERED IN ED: Medications - No data to display   IMPRESSION / MDM / [place] / ED COURSE  I reviewed the triage vital signs and the nursing notes.                              Differential diagnosis includes, but is not limited to, corneal abrasion, retinal artery occlusion, retinal vein occlusion, vitreous hemorrhage, retinal detachment, acute closed angle glaucoma  Patient's presentation is most consistent with acute complicated illness / injury requiring diagnostic workup.  ----------------------------------------- 11:28 PM on 06/05/2022 ----------------------------------------- S/W Dr. Lazarus Salines (Opth): He finds the patient's exam and workup overall reassuring without high suspicion for vitreous retinal detachment.  He is willing to see the patient in the office on Monday for reevaluation, with strict return precautions  over the weekend.  He wonders if symptoms may be related to an ocular migraine at this time.  Patient to the ED for evaluation of visual disturbance with onset of symptoms yesterday spontaneously.  Patient reported spontaneous resolution of symptoms as well yesterday.  She presents to the ED today noting some intermittent black floaters in the visual field.  Patient was evaluated for her complaints in ED, found have reassuring exam and workup.  No evidence of gross eye injury, hemorrhage, retinal detachment, or increased intraocular pressure.  Patient's diagnosis is consistent with visual disturbance.  Patient is to follow up with Dr. Rolley Sims on Monday as discussed, as needed or otherwise directed. Patient is given ED precautions to return to the ED for any worsening or new symptoms.     FINAL  CLINICAL IMPRESSION(S) / ED DIAGNOSES   Final diagnoses:  Visual disturbances     Rx / DC Orders   ED Discharge Orders     None        Note:  This document was prepared using Dragon voice recognition software and may include unintentional dictation errors.    Lissa Hoard, PA-C 06/05/22 2330    Shaune Pollack, MD 06/06/22 Marlyne Beards

## 2022-06-06 NOTE — ED Notes (Signed)
E signature pad not working. Pt educated on discharge instructions and verbalized understanding.  

## 2022-11-11 ENCOUNTER — Emergency Department: Payer: Medicaid Other

## 2022-11-11 ENCOUNTER — Emergency Department
Admission: EM | Admit: 2022-11-11 | Discharge: 2022-11-11 | Disposition: A | Discharge status: Home / Self Care | Payer: Medicaid Other | Attending: Emergency Medicine | Admitting: Emergency Medicine

## 2022-11-11 ENCOUNTER — Other Ambulatory Visit: Payer: Self-pay

## 2022-11-11 DIAGNOSIS — R1031 Right lower quadrant pain: Secondary | ICD-10-CM | POA: Insufficient documentation

## 2022-11-11 LAB — COMPREHENSIVE METABOLIC PANEL
ALT: 17 U/L (ref 0–44)
AST: 20 U/L (ref 15–41)
Albumin: 4 g/dL (ref 3.5–5.0)
Alkaline Phosphatase: 85 U/L (ref 38–126)
Anion gap: 11 (ref 5–15)
BUN: 14 mg/dL (ref 6–20)
CO2: 22 mmol/L (ref 22–32)
Calcium: 8.6 mg/dL — ABNORMAL LOW (ref 8.9–10.3)
Chloride: 103 mmol/L (ref 98–111)
Creatinine, Ser: 0.68 mg/dL (ref 0.44–1.00)
GFR, Estimated: >60 mL/min (ref >60)
Glucose, Bld: 234 mg/dL — ABNORMAL HIGH (ref 70–99)
Potassium: 3.7 mmol/L (ref 3.5–5.1)
Sodium: 136 mmol/L (ref 135–145)
Total Bilirubin: 1 mg/dL (ref 0.3–1.2)
Total Protein: 7.2 g/dL (ref 6.5–8.1)

## 2022-11-11 LAB — CBC
HCT: 41.6 % (ref 36.0–46.0)
Hemoglobin: 12.6 g/dL (ref 12.0–15.0)
MCH: 21.7 pg — ABNORMAL LOW (ref 26.0–34.0)
MCHC: 30.3 g/dL (ref 30.0–36.0)
MCV: 71.6 fL — ABNORMAL LOW (ref 80.0–100.0)
Platelets: 299 10*3/uL (ref 150–400)
RBC: 5.81 MIL/uL — ABNORMAL HIGH (ref 3.87–5.11)
RDW: 17.5 % — ABNORMAL HIGH (ref 11.5–15.5)
WBC: 8.5 10*3/uL (ref 4.0–10.5)
nRBC: 0 % (ref 0.0–0.2)

## 2022-11-11 LAB — URINALYSIS, ROUTINE W REFLEX MICROSCOPIC
Bacteria, UA: NONE SEEN
Bilirubin Urine: NEGATIVE
Glucose, UA: >=500 mg/dL — AB
Hgb urine dipstick: NEGATIVE
Ketones, ur: NEGATIVE mg/dL
Leukocytes,Ua: NEGATIVE
Nitrite: NEGATIVE
Protein, ur: NEGATIVE mg/dL
Specific Gravity, Urine: 1.033 — ABNORMAL HIGH (ref 1.005–1.030)
pH: 6 (ref 5.0–8.0)

## 2022-11-11 LAB — POC URINE PREG, ED: Preg Test, Ur: NEGATIVE

## 2022-11-11 LAB — LIPASE, BLOOD: Lipase: 31 U/L (ref 11–51)

## 2022-11-11 MED ORDER — MORPHINE SULFATE (PF) 4 MG/ML IV SOLN
4.0000 mg | Freq: Once | INTRAVENOUS | Status: AC
  Administered 2022-11-11: 4 mg via INTRAVENOUS
  Filled 2022-11-11: qty 1

## 2022-11-11 MED ORDER — ONDANSETRON HCL 4 MG/2ML IJ SOLN
4.0000 mg | Freq: Once | INTRAMUSCULAR | Status: AC
  Administered 2022-11-11: 4 mg via INTRAVENOUS
  Filled 2022-11-11: qty 2

## 2022-11-11 MED ORDER — IOHEXOL 300 MG/ML  SOLN
100.0000 mL | Freq: Once | INTRAMUSCULAR | Status: AC | PRN
  Administered 2022-11-11: 100 mL via INTRAVENOUS

## 2022-11-11 NOTE — ED Triage Notes (Signed)
Pt here with abd pain. Pt went to her primary who sent her here to rule out appendicitis. Pt states the pain started Mon night. Pt endorses nausea and some slight dizziness.

## 2022-11-11 NOTE — ED Provider Notes (Signed)
[facility] Provider Note    Event Date/Time   First MD Initiated Contact with Patient 11/11/22 1448     (approximate)   History   Abdominal Pain   HPI  Rose Tate is a 42 y.o. female who presents with complaints of right lower quadrant abdominal pain.  Patient was sent in by her PCP for evaluation for possible appendicitis.  Patient reports pain has been ongoing for the last 2 to 3 days, worse when going over bumps or moving.  No dysuria.  Normal stools.     Physical Exam   Triage Vital Signs: ED Triage Vitals  Enc Vitals Group     BP 11/11/22 1408 (!) 131/99     Pulse Rate 11/11/22 1408 (!) 103     Resp 11/11/22 1408 18     Temp 11/11/22 1408 98.7 F (37.1 C)     Temp Source 11/11/22 1408 Oral     SpO2 11/11/22 1408 97 %     Weight 11/11/22 1406 111.1 kg (244 lb 14.9 oz)     Height 11/11/22 1406 1.727 m (5\' 8" )     Head Circumference --      Peak Flow --      Pain Score 11/11/22 1406 3     Pain Loc --      Pain Edu? --      Excl. in [place]? --     Most recent vital signs: Vitals:   11/11/22 1408  BP: (!) 131/99  Pulse: (!) 103  Resp: 18  Temp: 98.7 F (37.1 C)  SpO2: 97%     General: Awake, no distress.  CV:  Good peripheral perfusion.  Resp:  Normal effort.  Abd:  No distention.  Tenderness palpation right lower quadrant Other:     ED Results / Procedures / Treatments   Labs (all labs ordered are listed, but only abnormal results are displayed) Labs Reviewed  COMPREHENSIVE METABOLIC PANEL - Abnormal; Notable for the following components:      Result Value   Glucose, Bld 234 (*)    Calcium 8.6 (*)    All other components within normal limits  CBC - Abnormal; Notable for the following components:   RBC 5.81 (*)    MCV 71.6 (*)    MCH 21.7 (*)    RDW 17.5 (*)    All other components within normal limits  URINALYSIS, ROUTINE W REFLEX MICROSCOPIC - Abnormal; Notable for the following components:   Color, Urine  YELLOW (*)    APPearance CLEAR (*)    Specific Gravity, Urine 1.033 (*)    Glucose, UA >=500 (*)    All other components within normal limits  LIPASE, BLOOD  POC URINE PREG, ED     EKG     RADIOLOGY CT abdomen pelvis viewed interpret by me no evidence of appendicitis, pending radiology review    PROCEDURES:  Critical Care performed:   Procedures   MEDICATIONS ORDERED IN ED: Medications  morphine (PF) 4 MG/ML injection 4 mg (4 mg Intravenous Given 11/11/22 1540)  ondansetron (ZOFRAN) injection 4 mg (4 mg Intravenous Given 11/11/22 1537)  iohexol (OMNIPAQUE) 300 MG/ML solution 100 mL (100 mLs Intravenous Contrast Given 11/11/22 1553)     IMPRESSION / MDM / ASSESSMENT AND PLAN / ED COURSE  I reviewed the triage vital signs and the nursing notes. Patient's presentation is most consistent with acute presentation with potential threat to life or bodily function.   Patient presents with  abdominal pain as detailed above, tenderness over the right lower quadrant, possibility of appendicitis high, will send for CT abdomen pelvis, lab work reviewed and is overall reassuring, normal white blood cell count.  Treated with IV morphine, IV Zofran  ----------------------------------------- 4:44 PM on 11/11/2022 ----------------------------------------- CT scan is reassuring, no evidence of appendicitis, patient reassured.  I recommended ultrasound however the patient declined as she does not think it would change management, she will treat with pain medication that she has at home, strict return precautions discussed, patient agrees to this plan.       FINAL CLINICAL IMPRESSION(S) / ED DIAGNOSES   Final diagnoses:  Right lower quadrant abdominal pain     Rx / DC Orders   ED Discharge Orders     None        Note:  This document was prepared using Dragon voice recognition software and may include unintentional dictation errors.   Jene Every, MD 11/11/22 [phone]

## 2022-11-22 ENCOUNTER — Encounter: Payer: Self-pay | Admitting: Cardiology

## 2022-11-22 ENCOUNTER — Ambulatory Visit: Payer: Medicaid Other | Attending: Cardiology | Admitting: Cardiology

## 2022-11-22 ENCOUNTER — Ambulatory Visit (INDEPENDENT_AMBULATORY_CARE_PROVIDER_SITE_OTHER): Payer: Medicaid Other

## 2022-11-22 VITALS — BP 136/96 | HR 80 | Ht 66.5 in | Wt 264.8 lb

## 2022-11-22 DIAGNOSIS — R002 Palpitations: Secondary | ICD-10-CM

## 2022-11-22 DIAGNOSIS — I10 Essential (primary) hypertension: Secondary | ICD-10-CM

## 2022-11-22 NOTE — Progress Notes (Signed)
Cardiology Office Note:    Date:  11/22/2022   ID:  Rose Tate, DOB [birth year], MRN [id number]  PCP:  [facility]   [facility] Providers Cardiologist:  Debbe Odea, MD     Referring MD: [facility]   Chief Complaint  Patient presents with   New Patient (Initial Visit)    Referred for evaluation of palpitations, referring notes scanned in media.  Patient explains intermittent sensation of heart "flutter" with left side chest pain that will last about 20 seconds.    History of Present Illness:    Rose Tate is a 42 y.o. female with a hx of hypertension, diabetes who presents due to palpitations.  States having symptoms of heart flutters and palpitations ongoing over the past 2 months.  Symptoms alcohol once or twice weekly, lasting 25 to 30 seconds.  Denies dizziness, presyncope or syncope.  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  Back pain likely causing elevated blood pressures today.  Her blood pressures at home is usually around 127 systolic.  Past Medical History:  Diagnosis Date   Diabetes mellitus without complication (HCC)    pre diabetes   Hypertension    OSA (obstructive sleep apnea)     Past Surgical History:  Procedure Laterality Date   CESAREAN SECTION     times 3   KIDNEY SURGERY  03/18/2022    Current Medications: Current Meds  Medication Sig   albuterol (VENTOLIN HFA) 108 (90 Base) MCG/ACT inhaler Inhale 1-2 puffs into the lungs every 6 (six) hours as needed for wheezing or shortness of breath.   carisoprodol (SOMA) 350 MG tablet Take 1 tablet (350 mg total) by mouth 3 (three) times daily as needed.   Dulaglutide (TRULICITY) 0.75 MG/0.5ML SOPN Trulicity 0.75 mg/0.5 mL subcutaneous pen injector  ADMINISTER 0.75 MG UNDER THE SKIN EVERY 7 DAYS   empagliflozin (JARDIANCE) 10 MG TABS tablet Jardiance 10 mg tablet   FEROSUL 325 (65 Fe) MG tablet  Take 325 mg by mouth every other day.   losartan (COZAAR) 25 MG tablet Take 1 tablet by mouth daily.     Allergies:   Augmentin [amoxicillin-pot clavulanate] and Lisinopril   Social History   Socioeconomic History   Marital status: Married    Spouse name: Not on file   Number of children: Not on file   Years of education: Not on file   Highest education level: Not on file  Occupational History   Not on file  Tobacco Use   Smoking status: Every Day    Packs/day: 0.50    Years: 23.00    Additional pack years: 0.00    Total pack years: 11.50    Types: Cigarettes   Smokeless tobacco: Never  Vaping Use   Vaping Use: Never used  Substance and Sexual Activity   Alcohol use: Not Currently   Drug use: No   Sexual activity: Not on file  Other Topics Concern   Not on file  Social History Narrative   Not on file   Social Determinants of Health   Financial Resource Strain: Not on file  Food Insecurity: Not on file  Transportation Needs: Not on file  Physical Activity: Not on file  Stress: Not on file  Social Connections: Not on file     Family History: The patient's family history includes Heart attack in her paternal grandfather; Heart disease in her paternal grandfather and paternal grandmother; Heart failure  in her paternal aunt, paternal grandmother, and paternal uncle; Heart murmur in her paternal grandmother.  ROS:   Please see the history of present illness.     All other systems reviewed and are negative.  EKGs/Labs/Other Studies Reviewed:    The following studies were reviewed today:   EKG shows normal sinus rhythm, possible left atrial enlargement.  Recent Labs: 11/11/2022: ALT 17; BUN 14; Creatinine, Ser 0.68; Hemoglobin 12.6; Platelets 299; Potassium 3.7; Sodium 136  Recent Lipid Panel No results found for: "CHOL", "TRIG", "HDL", "CHOLHDL", "VLDL", "LDLCALC", "LDLDIRECT"   Risk Assessment/Calculations:    HYPERTENSION CONTROL Vitals:   11/22/22 1410  11/22/22 1417  BP: (!) 140/90 (!) 136/96    The patient's blood pressure is elevated above target today.  In order to address the patient's elevated BP: Blood pressure will be monitored at home to determine if medication changes need to be made.            Physical Exam:    VS:  BP (!) 136/96 (BP Location: Right Arm, Patient Position: Sitting, Cuff Size: Large)   Pulse 80   Ht 5' 6.5" (1.689 m)   Wt 264 lb 12.8 oz (120.1 kg)   SpO2 97%   BMI 42.10 kg/m     Wt Readings from Last 3 Encounters:  11/22/22 264 lb 12.8 oz (120.1 kg)  11/11/22 244 lb 14.9 oz (111.1 kg)  06/05/22 245 lb (111.1 kg)     GEN:  Well nourished, well developed in no acute distress HEENT: Normal NECK: No JVD; No carotid bruits CARDIAC: RRR, no murmurs, rubs, gallops RESPIRATORY:  Clear to auscultation without rales, wheezing or rhonchi  ABDOMEN: Soft, non-tender, non-distended MUSCULOSKELETAL:  No edema; No deformity  SKIN: Warm and dry NEUROLOGIC:  Alert and oriented x 3 PSYCHIATRIC:  Normal affect   ASSESSMENT:    1. Palpitations   2. Primary hypertension    PLAN:    In order of problems listed above:  Palpitations, heart flutters.  Place cardiac monitor to evaluate any significant arrhythmias. Hypertension, BP elevated today, usually controlled.  Continue losartan 25 mg daily.  Follow-up after cardiac monitor.       Medication Adjustments/Labs and Tests Ordered: Current medicines are reviewed at length with the patient today.  Concerns regarding medicines are outlined above.  Orders Placed This Encounter  Procedures   LONG TERM MONITOR (3-14 DAYS)   EKG 12-Lead   No orders of the defined types were placed in this encounter.   Patient Instructions  Medication Instructions:   Your physician recommends that you continue on your current medications as directed. Please refer to the Current Medication list given to you today.  *If you need a refill on your cardiac medications  before your next appointment, please call your pharmacy*   Lab Work:  None Ordered  If you have labs (blood work) drawn today and your tests are completely normal, you will receive your results only by: MyChart Message (if you have MyChart) OR A paper copy in the mail If you have any lab test that is abnormal or we need to change your treatment, we will call you to review the results.   Testing/Procedures:  Your physician has recommended that you wear a Zio monitor.   This monitor is a medical device that records the heart's electrical activity. Doctors most often use these monitors to diagnose arrhythmias. Arrhythmias are problems with the speed or rhythm of the heartbeat. The monitor is a small device applied to  your chest. You can wear one while you do your normal daily activities. While wearing this monitor if you have any symptoms to push the button and record what you felt. Once you have worn this monitor for the period of time provider prescribed (Usually 14 days), you will return the monitor device in the postage paid box. Once it is returned they will download the data collected and provide [place] with a report which the provider will then review and we will call you with those results. Important tips:  Avoid showering during the first 24 hours of wearing the monitor. Avoid excessive sweating to help maximize wear time. Do not submerge the device, no hot tubs, and no swimming pools. Keep any lotions or oils away from the patch. After 24 hours you may shower with the patch on. Take brief showers with your back facing the shower head.  Do not remove patch once it has been placed because that will interrupt data and decrease adhesive wear time. Push the button when you have any symptoms and write down what you were feeling. Once you have completed wearing your monitor, remove and place into box which has postage paid and place in your outgoing mailbox.  If for some reason you have  misplaced your box then call our office and we can provide another box and/or mail it off for you.      Follow-Up: At [facility], you and your health needs are our priority.  As part of our continuing mission to provide you with exceptional heart care, we have created designated Provider Care Teams.  These Care Teams include your primary Cardiologist (physician) and Advanced Practice Providers (APPs -  Physician Assistants and Nurse Practitioners) who all work together to provide you with the care you need, when you need it.  We recommend signing up for the patient portal called "MyChart".  Sign up information is provided on this After Visit Summary.  MyChart is used to connect with patients for Virtual Visits (Telemedicine).  Patients are able to view lab/test results, encounter notes, upcoming appointments, etc.  Non-urgent messages can be sent to your provider as well.   To learn more about what you can do with MyChart, go to [email or web address].    Your next appointment:    After Zio   Provider:   You may see Debbe Odea, MD or one of the following Advanced Practice Providers on your designated Care Team:   Nicolasa Ducking, NP Eula Listen, PA-C Cadence Fransico Michael, PA-C Charlsie Quest, NP    Signed, Debbe Odea, MD  11/22/2022 3:12 PM    [facility]

## 2022-11-22 NOTE — Patient Instructions (Signed)
Medication Instructions:   Your physician recommends that you continue on your current medications as directed. Please refer to the Current Medication list given to you today.  *If you need a refill on your cardiac medications before your next appointment, please call your pharmacy*   Lab Work:  None Ordered  If you have labs (blood work) drawn today and your tests are completely normal, you will receive your results only by: MyChart Message (if you have MyChart) OR A paper copy in the mail If you have any lab test that is abnormal or we need to change your treatment, we will call you to review the results.   Testing/Procedures:  Your physician has recommended that you wear a Zio monitor.   This monitor is a medical device that records the heart's electrical activity. Doctors most often use these monitors to diagnose arrhythmias. Arrhythmias are problems with the speed or rhythm of the heartbeat. The monitor is a small device applied to your chest. You can wear one while you do your normal daily activities. While wearing this monitor if you have any symptoms to push the button and record what you felt. Once you have worn this monitor for the period of time provider prescribed (Usually 14 days), you will return the monitor device in the postage paid box. Once it is returned they will download the data collected and provide us with a report which the provider will then review and we will call you with those results. Important tips:  Avoid showering during the first 24 hours of wearing the monitor. Avoid excessive sweating to help maximize wear time. Do not submerge the device, no hot tubs, and no swimming pools. Keep any lotions or oils away from the patch. After 24 hours you may shower with the patch on. Take brief showers with your back facing the shower head.  Do not remove patch once it has been placed because that will interrupt data and decrease adhesive wear time. Push the button  when you have any symptoms and write down what you were feeling. Once you have completed wearing your monitor, remove and place into box which has postage paid and place in your outgoing mailbox.  If for some reason you have misplaced your box then call our office and we can provide another box and/or mail it off for you.      Follow-Up: At [facility], you and your health needs are our priority.  As part of our continuing mission to provide you with exceptional heart care, we have created designated Provider Care Teams.  These Care Teams include your primary Cardiologist (physician) and Advanced Practice Providers (APPs -  Physician Assistants and Nurse Practitioners) who all work together to provide you with the care you need, when you need it.  We recommend signing up for the patient portal called "MyChart".  Sign up information is provided on this After Visit Summary.  MyChart is used to connect with patients for Virtual Visits (Telemedicine).  Patients are able to view lab/test results, encounter notes, upcoming appointments, etc.  Non-urgent messages can be sent to your provider as well.   To learn more about what you can do with MyChart, go to [email or web address].    Your next appointment:    After Zio   Provider:   You may see Brian Agbor-Etang, MD or one of the following Advanced Practice Providers on your designated Care Team:   Christopher Berge, NP Ryan Dunn, PA-C Cadence Furth, PA-C Sheri Hammock, NP 

## 2022-11-24 DIAGNOSIS — R002 Palpitations: Secondary | ICD-10-CM

## 2022-12-09 ENCOUNTER — Ambulatory Visit
Admission: RE | Admit: 2022-12-09 | Discharge: 2022-12-09 | Disposition: A | Discharge status: Home / Self Care | Payer: Medicaid Other | Source: Ambulatory Visit | Attending: Physician Assistant | Admitting: Physician Assistant

## 2022-12-09 ENCOUNTER — Other Ambulatory Visit: Payer: Self-pay | Admitting: Physician Assistant

## 2022-12-09 ENCOUNTER — Other Ambulatory Visit: Payer: Self-pay | Admitting: Primary Care

## 2022-12-09 ENCOUNTER — Ambulatory Visit
Admission: RE | Admit: 2022-12-09 | Discharge: 2022-12-09 | Disposition: A | Discharge status: Home / Self Care | Payer: Medicaid Other | Attending: Physician Assistant | Admitting: Physician Assistant

## 2022-12-09 DIAGNOSIS — M25562 Pain in left knee: Secondary | ICD-10-CM | POA: Insufficient documentation

## 2022-12-09 DIAGNOSIS — M25551 Pain in right hip: Secondary | ICD-10-CM

## 2022-12-23 ENCOUNTER — Other Ambulatory Visit: Payer: Self-pay | Admitting: Primary Care

## 2022-12-23 DIAGNOSIS — K222 Esophageal obstruction: Secondary | ICD-10-CM

## 2022-12-23 DIAGNOSIS — R09A2 Foreign body sensation, throat: Secondary | ICD-10-CM

## 2022-12-23 DIAGNOSIS — R198 Other specified symptoms and signs involving the digestive system and abdomen: Secondary | ICD-10-CM

## 2023-01-05 ENCOUNTER — Ambulatory Visit: Payer: Medicaid Other | Attending: Primary Care

## 2023-01-11 ENCOUNTER — Other Ambulatory Visit: Payer: Self-pay

## 2023-01-11 ENCOUNTER — Emergency Department
Admission: EM | Admit: 2023-01-11 | Discharge: 2023-01-11 | Disposition: A | Discharge status: Home / Self Care | Payer: Medicaid Other | Attending: Emergency Medicine | Admitting: Emergency Medicine

## 2023-01-11 ENCOUNTER — Emergency Department: Payer: Medicaid Other

## 2023-01-11 ENCOUNTER — Encounter: Payer: Self-pay | Admitting: Emergency Medicine

## 2023-01-11 DIAGNOSIS — I10 Essential (primary) hypertension: Secondary | ICD-10-CM | POA: Insufficient documentation

## 2023-01-11 DIAGNOSIS — R519 Headache, unspecified: Secondary | ICD-10-CM

## 2023-01-11 DIAGNOSIS — Z79899 Other long term (current) drug therapy: Secondary | ICD-10-CM | POA: Diagnosis not present

## 2023-01-11 DIAGNOSIS — Z7984 Long term (current) use of oral hypoglycemic drugs: Secondary | ICD-10-CM | POA: Insufficient documentation

## 2023-01-11 DIAGNOSIS — E119 Type 2 diabetes mellitus without complications: Secondary | ICD-10-CM | POA: Insufficient documentation

## 2023-01-11 LAB — CBC
HCT: 41.3 % (ref 36.0–46.0)
Hemoglobin: 12.5 g/dL (ref 12.0–15.0)
MCH: 22.6 pg — ABNORMAL LOW (ref 26.0–34.0)
MCHC: 30.3 g/dL (ref 30.0–36.0)
MCV: 74.7 fL — ABNORMAL LOW (ref 80.0–100.0)
Platelets: 298 10*3/uL (ref 150–400)
RBC: 5.53 MIL/uL — ABNORMAL HIGH (ref 3.87–5.11)
RDW: 17.3 % — ABNORMAL HIGH (ref 11.5–15.5)
WBC: 8.1 10*3/uL (ref 4.0–10.5)
nRBC: 0 % (ref 0.0–0.2)

## 2023-01-11 LAB — BASIC METABOLIC PANEL
Anion gap: 9 (ref 5–15)
BUN: 13 mg/dL (ref 6–20)
CO2: 23 mmol/L (ref 22–32)
Calcium: 9 mg/dL (ref 8.9–10.3)
Chloride: 104 mmol/L (ref 98–111)
Creatinine, Ser: 0.68 mg/dL (ref 0.44–1.00)
GFR, Estimated: >60 mL/min (ref >60)
Glucose, Bld: 217 mg/dL — ABNORMAL HIGH (ref 70–99)
Potassium: 3.7 mmol/L (ref 3.5–5.1)
Sodium: 136 mmol/L (ref 135–145)

## 2023-01-11 LAB — TROPONIN I (HIGH SENSITIVITY): Troponin I (High Sensitivity): 4 ng/L (ref <18)

## 2023-01-11 LAB — POC URINE PREG, ED: Preg Test, Ur: NEGATIVE

## 2023-01-11 LAB — HCG, QUANTITATIVE, PREGNANCY: hCG, Beta Chain, Quant, S: <1 m[IU]/mL (ref <5)

## 2023-01-11 MED ORDER — PROCHLORPERAZINE EDISYLATE 10 MG/2ML IJ SOLN
10.0000 mg | Freq: Once | INTRAMUSCULAR | Status: DC
  Filled 2023-01-11: qty 2

## 2023-01-11 MED ORDER — KETOROLAC TROMETHAMINE 30 MG/ML IJ SOLN
30.0000 mg | Freq: Once | INTRAMUSCULAR | Status: AC
  Administered 2023-01-11: 30 mg via INTRAVENOUS
  Filled 2023-01-11: qty 1

## 2023-01-11 NOTE — ED Notes (Signed)
Poct pregnancy Negative 

## 2023-01-11 NOTE — ED Notes (Signed)
Pt reports waking up this am with a headache.  Pt has dizziness and nausea.  No chest pain or sob.   Pt took her bp meds today.  Pt alert, speech clear.  Iv in place.

## 2023-01-11 NOTE — ED Provider Notes (Signed)
[facility] Provider Note    Event Date/Time   First MD Initiated Contact with Patient 01/11/23 0129     (approximate)   History   Headache   HPI  Rose Tate is a 42 y.o. female with history of hypertension on losartan, type 2 diabetes who presents to the emergency department with diffuse headache.  She states it feels like a sharp pain.  She has had similar headaches when her blood pressure has been elevated.  Reports it was in the 200s/100s at home.  Took her losartan at 8 PM tonight.  States about a week ago her blood pressure was normal in the 130s/70s at her PCP appointment.  She reports some tingling in the right arm but no numbness.  No chest pain or shortness of breath that is new for her.  She always has some shortness of breath due to being a smoker.  No focal weakness.  No vision changes.  No sudden onset headache.  States she woke up from sleep with a headache.  States she took Tylenol prior to arrival.  Reports her headache is already improving.  History provided by patient.    Past Medical History:  Diagnosis Date   Diabetes mellitus without complication (HCC)    pre diabetes   Hypertension    OSA (obstructive sleep apnea)     Past Surgical History:  Procedure Laterality Date   CESAREAN SECTION     times 3   KIDNEY SURGERY  03/18/2022    MEDICATIONS:  Prior to Admission medications   Medication Sig Start Date End Date Taking? Authorizing Provider  albuterol (VENTOLIN HFA) 108 (90 Base) MCG/ACT inhaler Inhale 1-2 puffs into the lungs every 6 (six) hours as needed for wheezing or shortness of breath. 11/19/19   Candis Schatz, PA-C  carisoprodol (SOMA) 350 MG tablet Take 1 tablet (350 mg total) by mouth 3 (three) times daily as needed. 06/07/18   Myrna Blazer, MD  Dulaglutide (TRULICITY) 0.75 MG/0.5ML SOPN Trulicity 0.75 mg/0.5 mL subcutaneous pen injector  ADMINISTER 0.75 MG UNDER THE SKIN EVERY 7 DAYS    [provider]  empagliflozin (JARDIANCE) 10 MG TABS tablet Jardiance 10 mg tablet    [provider]  FEROSUL 325 (65 Fe) MG tablet Take 325 mg by mouth every other day. 09/01/20   [provider]  losartan (COZAAR) 25 MG tablet Take 1 tablet by mouth daily. 04/28/20 11/22/22  [provider]  losartan-hydrochlorothiazide (HYZAAR) 50-12.5 MG tablet Take 1 tablet by mouth daily.  11/19/19  [provider]    Physical Exam   Triage Vital Signs: ED Triage Vitals  Encounter Vitals Group     BP 01/11/23 0046 (!) 164/104     Systolic BP Percentile --      Diastolic BP Percentile --      Pulse Rate 01/11/23 0046 74     Resp 01/11/23 0046 18     Temp 01/11/23 0046 98.8 F (37.1 C)     Temp Source 01/11/23 0046 Oral     SpO2 01/11/23 0046 99 %     Weight --      Height 01/11/23 0009 5\' 7"  (1.702 m)     Head Circumference --      Peak Flow --      Pain Score 01/11/23 0009 6     Pain Loc --      Pain Education --      Exclude from [employer]  Chart --     Most recent vital signs: Vitals:   01/11/23 0046 01/11/23 0200  BP:  135/88  Pulse: 74 75  Resp: 18   Temp: 98.8 F (37.1 C)   SpO2: 99% 96%    CONSTITUTIONAL: Alert, responds appropriately to questions. Well-appearing; well-nourished HEAD: Normocephalic, atraumatic EYES: Conjunctivae clear, pupils appear equal, sclera nonicteric ENT: normal nose; moist mucous membranes NECK: Supple, normal ROM CARD: RRR; S1 and S2 appreciated RESP: Normal chest excursion without splinting or tachypnea; breath sounds clear and equal bilaterally; no wheezes, no rhonchi, no rales, no hypoxia or respiratory distress, speaking full sentences ABD/GI: Non-distended; soft, non-tender, no rebound, no guarding, no peritoneal signs BACK: The back appears normal EXT: Normal ROM in all joints; no deformity noted, no edema, no calf tenderness or calf swelling SKIN: Normal color for age and race; warm; no rash on exposed  skin NEURO: Moves all extremities equally, normal speech, normal sensation diffusely, cranial nerves II through XII intact, normal gait PSYCH: The patient's mood and manner are appropriate.   ED Results / Procedures / Treatments   LABS: (all labs ordered are listed, but only abnormal results are displayed) Labs Reviewed  CBC - Abnormal; Notable for the following components:      Result Value   RBC 5.53 (*)    MCV 74.7 (*)    MCH 22.6 (*)    RDW 17.3 (*)    All other components within normal limits  BASIC METABOLIC PANEL - Abnormal; Notable for the following components:   Glucose, Bld 217 (*)    All other components within normal limits  HCG, QUANTITATIVE, PREGNANCY  TROPONIN I (HIGH SENSITIVITY)     EKG:  EKG Interpretation Date/Time:  Tuesday January 11 2023 00:58:20 EDT Ventricular Rate:  80 PR Interval:  142 QRS Duration:  76 QT Interval:  386 QTC Calculation: 445 R Axis:   90  Text Interpretation: Normal sinus rhythm Rightward axis Borderline ECG When compared with ECG of 08-Aug-2018 18:42, No significant change was found Confirmed by Rochele Raring [phone]) on 01/11/2023 1:23:17 AM         RADIOLOGY: My personal review and interpretation of imaging: CT head unremarkable.  I have personally reviewed all radiology reports.   CT HEAD WO CONTRAST ( )  Result Date: 01/11/2023 CLINICAL DATA:  Sudden onset headaches EXAM: CT HEAD WITHOUT CONTRAST TECHNIQUE: Contiguous axial images were obtained from the base of the skull through the vertex without intravenous contrast. RADIATION DOSE REDUCTION: This exam was performed according to the departmental dose-optimization program which includes automated exposure control, adjustment of the mA and/or kV according to patient size and/or use of iterative reconstruction technique. COMPARISON:  None Available. FINDINGS: Brain: No evidence of acute infarction, hemorrhage, hydrocephalus, extra-axial collection or mass lesion/mass  effect. Vascular: No hyperdense vessel or unexpected calcification. Skull: Normal. Negative for fracture or focal lesion. Sinuses/Orbits: No acute finding. Other: None. IMPRESSION: No acute intracranial abnormality noted. Electronically Signed   By: Alcide Clever M.D.   On: 01/11/2023 02:30     PROCEDURES:  Critical Care performed: No    .1-3 Lead EKG Interpretation  Performed by: , Layla Maw, DO Authorized by: , Layla Maw, DO     Interpretation: normal     ECG rate:  74   ECG rate assessment: normal     Rhythm: sinus rhythm     Ectopy: none     Conduction: normal       IMPRESSION / MDM / ASSESSMENT AND  PLAN / ED COURSE  I reviewed the triage vital signs and the nursing notes.    Patient here with hypertensive headache.  The patient is on the cardiac monitor to evaluate for evidence of arrhythmia and/or significant heart rate changes.   DIFFERENTIAL DIAGNOSIS (includes but not limited to):   Hypertensive headache, less likely intracranial hemorrhage, ruptured aneurysm.  Doubt stroke, meningitis, cavernous sinus thrombosis.   Patient's presentation is most consistent with acute presentation with potential threat to life or bodily function.   PLAN: Labs from triage are pending.  Will obtain CT head.  Have offered IV Compazine for headache but patient declines.  Will continue to monitor blood pressure.   MEDICATIONS GIVEN IN ED: Medications  ketorolac (TORADOL) 30 MG/ML injection 30 mg (30 mg Intravenous Given 01/11/23 0257)     ED COURSE: Labs show no leukocytosis, normal hemoglobin, normal electrolytes, negative troponin.  CT head reviewed and interpreted by myself and the radiologist and shows no acute abnormality.  Blood pressure has already come down on its own to 130s/80s.  Patient declined Compazine.  Will give Toradol and reassess.   3:36 AM  Pt's headache has significantly improved.  Blood pressure in the 130s/80s.  Will have her follow-up closely with  her PCP.    At this time, I do not feel there is any life-threatening condition present. I reviewed all nursing notes, vitals, pertinent previous records.  All lab and urine results, EKGs, imaging ordered have been independently reviewed and interpreted by myself.  I reviewed all available radiology reports from any imaging ordered this visit.  Based on my assessment, I feel the patient is safe to be discharged home without further emergent workup and can continue workup as an outpatient as needed. Discussed all findings, treatment plan as well as usual and customary return precautions.  They verbalize understanding and are comfortable with this plan.  Outpatient follow-up has been provided as needed.  All questions have been answered.   CONSULTS: None   OUTSIDE RECORDS REVIEWED: Reviewed previous PCP notes over the last several months.       FINAL CLINICAL IMPRESSION(S) / ED DIAGNOSES   Final diagnoses:  Headache due to hypertension     Rx / DC Orders   ED Discharge Orders     None        Note:  This document was prepared using Dragon voice recognition software and may include unintentional dictation errors.   , Layla Maw, DO 01/11/23 [phone]

## 2023-01-11 NOTE — ED Notes (Signed)
Pt driving  md aware.  Meds on hold at this time.

## 2023-01-11 NOTE — Discharge Instructions (Signed)
You may alternate Tylenol 1000 mg every 6 hours as needed for pain, fever and Ibuprofen 800 mg every 6-8 hours as needed for pain, fever.  Please take Ibuprofen with food.  Do not take more than 4000 mg of Tylenol (acetaminophen) in a 24 hour period.  Your labs, head CT were reassuring today.  Your blood pressure improved on its own to the 130s/80s.  I recommend follow-up with your primary care doctor.

## 2023-01-11 NOTE — ED Triage Notes (Signed)
Patient ambulatory to triage with steady gait, without difficulty or distress noted; pt reports frontal HA all day today accomp by dizziness and HTN; denies hx of HA but has hx HTN

## 2023-01-11 NOTE — ED Notes (Signed)
Patient discharged at this time. Ambulated to lobby with independent and steady gait. Breathing unlabored speaking in full sentences. Verbalized understanding of all discharge, follow up, and medication teaching. Discharged homed with all belongings.   

## 2023-01-26 ENCOUNTER — Ambulatory Visit: Payer: Medicaid Other | Attending: Nurse Practitioner | Admitting: Nurse Practitioner

## 2023-01-26 ENCOUNTER — Encounter: Payer: Self-pay | Admitting: Nurse Practitioner

## 2023-01-26 VITALS — BP 128/100 | HR 78 | Ht 67.0 in | Wt 259.0 lb

## 2023-01-26 DIAGNOSIS — Z794 Long term (current) use of insulin: Secondary | ICD-10-CM

## 2023-01-26 DIAGNOSIS — E118 Type 2 diabetes mellitus with unspecified complications: Secondary | ICD-10-CM | POA: Diagnosis not present

## 2023-01-26 DIAGNOSIS — I10 Essential (primary) hypertension: Secondary | ICD-10-CM | POA: Diagnosis not present

## 2023-01-26 DIAGNOSIS — Z720 Tobacco use: Secondary | ICD-10-CM

## 2023-01-26 DIAGNOSIS — R002 Palpitations: Secondary | ICD-10-CM | POA: Diagnosis not present

## 2023-01-26 DIAGNOSIS — G4733 Obstructive sleep apnea (adult) (pediatric): Secondary | ICD-10-CM

## 2023-01-26 MED ORDER — LOSARTAN POTASSIUM 100 MG PO TABS: 100.0000 mg | ORAL_TABLET | Freq: Every day | ORAL | 3 refills | Status: AC

## 2023-01-26 NOTE — Patient Instructions (Signed)
Medication Instructions:  Your physician recommends the following medication changes.  START TAKING: Losartan 100 mg once daily  *If you need a refill on your cardiac medications before your next appointment, please call your pharmacy*  Lab Work: Your provider would like for you to return in 1 to 2 weeks to have the following labs drawn: BMET.   Please go to [facility] [address] ([facility] [address], [place] [address] You do not need an appointment.  They are open from 7:30 am-4 pm.  Lunch from 1:00 pm- 2:00 pm You not need to be fasting.  You may also go to any of these [facility] locations:  [employer]  - [address] [employer] [address] [employer] Officer)  If you have labs (blood work) drawn today and your tests are completely normal, you will receive your results only by: [employer] (if you have MyChart) OR A paper copy in the mail If you have any lab test that is abnormal or we need to change your treatment, we will call you to review the results.  Testing/Procedures: None  Follow-Up: At [facility], you and your health needs are our priority.  As part of our continuing mission to provide you with exceptional heart care, we have created designated Provider Care Teams.  These Care Teams include your primary Cardiologist (physician) and Advanced Practice Providers (APPs -  Physician Assistants and Nurse Practitioners) who all work together to provide you with the care you need, when you need it.  We recommend signing up for the patient portal called "MyChart".  Sign up information is provided on this After Visit Summary.  MyChart is used to connect with patients for Virtual Visits (Telemedicine).  Patients are able to view lab/test results, encounter notes, upcoming appointments, etc.  Non-urgent messages can be sent to your provider as well.   To learn more about what you can do with MyChart, go to [email or web address].    Your next  appointment:   3 month(s)  Provider:   You may see Debbe Odea, MD or one of the following Advanced Practice Providers on your designated Care Team:   Nicolasa Ducking, NP
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
## 2023-02-24 ENCOUNTER — Emergency Department: Payer: Medicaid Other

## 2023-02-24 ENCOUNTER — Emergency Department
Admission: EM | Admit: 2023-02-24 | Discharge: 2023-02-24 | Disposition: A | Discharge status: Home / Self Care | Payer: Medicaid Other | Attending: Emergency Medicine | Admitting: Emergency Medicine

## 2023-02-24 ENCOUNTER — Encounter: Payer: Self-pay | Admitting: Emergency Medicine

## 2023-02-24 ENCOUNTER — Other Ambulatory Visit: Payer: Self-pay

## 2023-02-24 DIAGNOSIS — Z20822 Contact with and (suspected) exposure to covid-19: Secondary | ICD-10-CM | POA: Diagnosis not present

## 2023-02-24 DIAGNOSIS — I10 Essential (primary) hypertension: Secondary | ICD-10-CM | POA: Insufficient documentation

## 2023-02-24 DIAGNOSIS — B349 Viral infection, unspecified: Secondary | ICD-10-CM | POA: Diagnosis not present

## 2023-02-24 DIAGNOSIS — Z79899 Other long term (current) drug therapy: Secondary | ICD-10-CM | POA: Diagnosis not present

## 2023-02-24 DIAGNOSIS — R059 Cough, unspecified: Secondary | ICD-10-CM | POA: Diagnosis present

## 2023-02-24 LAB — SARS CORONAVIRUS 2 BY RT PCR: SARS Coronavirus 2 by RT PCR: NEGATIVE

## 2023-02-24 NOTE — ED Notes (Signed)
See triage note  Presents with some tightness in chest and elevated blood pressure

## 2023-02-24 NOTE — ED Triage Notes (Signed)
Patient is here for hypertension, she took her blood pressure multiple times at work, last reading was 189/113 (BP here is 150/95); Reports "tightness" in her chest

## 2023-02-24 NOTE — ED Provider Notes (Signed)
   [facility] Provider Note    Event Date/Time   First MD Initiated Contact with Patient 02/24/23 1828     (approximate)   History   High blood pressure  HPI  Rose Tate is a 42 y.o. female who presents with complaints of 1 day of myalgias, fatigue.  She felt flushed at work so checked her blood pressure and found to be elevated.  She denies chest pain to me.  Occasional cough.  She does take losartan, and her doctor has increased to twice over the past month     Physical Exam   Triage Vital Signs: ED Triage Vitals  Encounter Vitals Group     BP 02/24/23 1755 (!) 150/95     Systolic BP Percentile --      Diastolic BP Percentile --      Pulse Rate 02/24/23 1755 81     Resp 02/24/23 1755 19     Temp 02/24/23 1755 99.2 F (37.3 C)     Temp Source 02/24/23 1755 Oral     SpO2 02/24/23 1755 99 %     Weight 02/24/23 1756 115.7 kg (255 lb)     Height 02/24/23 1756 1.727 m (5\' 8" )     Head Circumference --      Peak Flow --      Pain Score 02/24/23 1756 0     Pain Loc --      Pain Education --      Exclude from Growth Chart --     Most recent vital signs: Vitals:   02/24/23 1755  BP: (!) 150/95  Pulse: 81  Resp: 19  Temp: 99.2 F (37.3 C)  SpO2: 99%     General: Awake, no distress.  CV:  Good peripheral perfusion.  Resp:  Normal effort.  Abd:  No distention.  Other:     ED Results / Procedures / Treatments   Labs (all labs ordered are listed, but only abnormal results are displayed) Labs Reviewed  SARS CORONAVIRUS 2 BY RT PCR     EKG  ED ECG REPORT I, Jene Every, the attending physician, personally viewed and interpreted this ECG.  Date: 02/24/2023  Rhythm: normal sinus rhythm QRS Axis: normal Intervals: normal ST/T Wave abnormalities: normal Narrative Interpretation: no evidence of acute ischemia    RADIOLOGY     PROCEDURES:  Critical Care performed:   Procedures   MEDICATIONS ORDERED IN  ED: Medications - No data to display   IMPRESSION / MDM / ASSESSMENT AND PLAN / ED COURSE  I reviewed the triage vital signs and the nursing notes. Patient's presentation is most consistent with acute complicated illness / injury requiring diagnostic workup.  Patient presents with symptoms as above, I suspect she is developing viral illness likely the cause of her flushing and myalgias.  Her temperature is mildly elevated.  Blood pressure has improved without intervention here, no indication for lab work or admission,  Recommend outpatient follow-up, COVID test sent, supportive care recommended        FINAL CLINICAL IMPRESSION(S) / ED DIAGNOSES   Final diagnoses:  Hypertension, unspecified type  Viral illness     Rx / DC Orders   ED Discharge Orders     None        Note:  This document was prepared using Dragon voice recognition software and may include unintentional dictation errors.   Jene Every, MD 02/24/23 2037

## 2023-05-11 ENCOUNTER — Ambulatory Visit: Payer: Medicaid Other | Admitting: Nurse Practitioner

## 2023-08-19 ENCOUNTER — Other Ambulatory Visit: Payer: Self-pay | Admitting: Primary Care

## 2023-08-19 DIAGNOSIS — Z1231 Encounter for screening mammogram for malignant neoplasm of breast: Secondary | ICD-10-CM

## 2023-08-29 ENCOUNTER — Ambulatory Visit
Admission: EM | Admit: 2023-08-29 | Discharge: 2023-08-29 | Disposition: A | Discharge status: Home / Self Care | Attending: Emergency Medicine | Admitting: Emergency Medicine

## 2023-08-29 DIAGNOSIS — Z114 Encounter for screening for human immunodeficiency virus [HIV]: Secondary | ICD-10-CM | POA: Insufficient documentation

## 2023-08-29 DIAGNOSIS — Z113 Encounter for screening for infections with a predominantly sexual mode of transmission: Secondary | ICD-10-CM | POA: Diagnosis not present

## 2023-08-29 DIAGNOSIS — R509 Fever, unspecified: Secondary | ICD-10-CM | POA: Insufficient documentation

## 2023-08-29 DIAGNOSIS — N898 Other specified noninflammatory disorders of vagina: Secondary | ICD-10-CM | POA: Insufficient documentation

## 2023-08-29 DIAGNOSIS — Z206 Contact with and (suspected) exposure to human immunodeficiency virus [HIV]: Secondary | ICD-10-CM | POA: Diagnosis present

## 2023-08-29 LAB — RESP PANEL BY RT-PCR (FLU A&B, COVID) ARPGX2
Influenza A by PCR: NEGATIVE
Influenza B by PCR: NEGATIVE
SARS Coronavirus 2 by RT PCR: NEGATIVE

## 2023-08-29 LAB — URINALYSIS, W/ REFLEX TO CULTURE (INFECTION SUSPECTED)
Bilirubin Urine: NEGATIVE
Glucose, UA: >=500 mg/dL — AB
Hgb urine dipstick: NEGATIVE
Ketones, ur: NEGATIVE mg/dL
Leukocytes,Ua: NEGATIVE
Nitrite: NEGATIVE
Protein, ur: NEGATIVE mg/dL
Specific Gravity, Urine: 1.015 (ref 1.005–1.030)
pH: 5.5 (ref 5.0–8.0)

## 2023-08-29 LAB — CBC WITH DIFFERENTIAL/PLATELET
Abs Immature Granulocytes: 0.03 10*3/uL (ref 0.00–0.07)
Basophils Absolute: 0 10*3/uL (ref 0.0–0.1)
Basophils Relative: 0 %
Eosinophils Absolute: 0.1 10*3/uL (ref 0.0–0.5)
Eosinophils Relative: 1 %
HCT: 48.3 % — ABNORMAL HIGH (ref 36.0–46.0)
Hemoglobin: 15.4 g/dL — ABNORMAL HIGH (ref 12.0–15.0)
Immature Granulocytes: 0 %
Lymphocytes Relative: 8 %
Lymphs Abs: 0.7 10*3/uL (ref 0.7–4.0)
MCH: 25.2 pg — ABNORMAL LOW (ref 26.0–34.0)
MCHC: 31.9 g/dL (ref 30.0–36.0)
MCV: 79.2 fL — ABNORMAL LOW (ref 80.0–100.0)
Monocytes Absolute: 0.3 10*3/uL (ref 0.1–1.0)
Monocytes Relative: 3 %
Neutro Abs: 7.9 10*3/uL — ABNORMAL HIGH (ref 1.7–7.7)
Neutrophils Relative %: 88 %
Platelets: 315 10*3/uL (ref 150–400)
RBC: 6.1 MIL/uL — ABNORMAL HIGH (ref 3.87–5.11)
RDW: 14.7 % (ref 11.5–15.5)
WBC: 9 10*3/uL (ref 4.0–10.5)
nRBC: 0 % (ref 0.0–0.2)

## 2023-08-29 LAB — HIV ANTIBODY (ROUTINE TESTING W REFLEX): HIV Screen 4th Generation wRfx: NONREACTIVE

## 2023-08-29 MED ORDER — ACETAMINOPHEN 325 MG PO TABS
975.0000 mg | ORAL_TABLET | Freq: Once | ORAL | Status: AC
Start: 2023-08-29 — End: 2023-08-29
  Administered 2023-08-29: 975 mg via ORAL

## 2023-08-29 MED ORDER — FLUCONAZOLE 150 MG PO TABS: 150.0000 mg | ORAL_TABLET | ORAL | 0 refills | Status: AC

## 2023-08-29 MED ORDER — ONDANSETRON HCL 4 MG PO TABS: 4.0000 mg | ORAL_TABLET | Freq: Four times a day (QID) | ORAL | 0 refills | Status: AC

## 2023-08-29 NOTE — Discharge Instructions (Addendum)
 Your urinalysis did not show evidence of urinary tract infection and your CBC did not show elevated white blood count indicating systemic infection.  Given that you are recently on Diflucan and experiencing vaginal itching I do suspect that you have a vaginal yeast infection.  Take 1 Diflucan tablet now and repeat dosing every 3 days for total of 3 doses.  Your vaginal swab will be back in the next 24 hours and if you test positive for any infection you will be contacted by phone and treatment options will be provided.  Additionally, your testing for HIV will also be back in the next 24 hours and if you test positive you will be referred to infectious disease for treatment.  Use over-the-counter Tylenol and/or ibuprofen as needed for fever or pain.  Use the Zofran, 4 mg every 6 hours, as needed for nausea.  If you develop any sharp increase in abdominal pain, high fevers not controlled by Tylenol or ibuprofen, nausea or vomiting you cannot keep down fluids or medication, or start vomiting blood or passing blood in your stool you need to go to the ER for evaluation.

## 2023-08-29 NOTE — ED Provider Notes (Signed)
 [facility]    CSN: [id number] Arrival date & time: 08/29/23  1233      History   Chief Complaint Chief Complaint  Patient presents with   Fever    HPI Rose Tate is a 43 y.o. female.   HPI  43 year old female with past medical history significant for diabetes, hypertension, morbid obesity, sleep apnea, and palpitations presents for evaluation of fever which started today with a Tmax of 101.8, chills, body aches, painful urination with urinary urgency and frequency, cloudiness of the urine, nausea, suprapubic abdominal pain, and vaginal itching.  She denies any runny nose, nasal congestion, sore throat, or cough.  She has not noticed any blood in her urine.  She denies any vomiting or diarrhea.  She reports that previous sexual partner that she was having unprotected sex with may have HIV or AIDS so she is requesting HIV testing.  Past Medical History:  Diagnosis Date   Diabetes mellitus without complication (HCC)    pre diabetes   Hypertension    Morbid obesity (HCC)    OSA (obstructive sleep apnea)    Palpitations    a. 10/2022 Zio: Predominantly sinus rhythm at 91 (23-138).  Mobitz 1 present.  Rare PACs and PVCs.  Triggered events associated with sinus rhythm/sinus tachycardia.    There are no active problems to display for this patient.   Past Surgical History:  Procedure Laterality Date   CESAREAN SECTION     times 3   KIDNEY SURGERY  03/18/2022    OB History   No obstetric history on file.      Home Medications    Prior to Admission medications   Medication Sig Start Date End Date Taking? Authorizing Provider  fluconazole (DIFLUCAN) 150 MG tablet Take 1 tablet (150 mg total) by mouth every 3 (three) days for 3 doses. 08/29/23 09/05/23 Yes Becky Augusta, NP  ondansetron (ZOFRAN) 4 MG tablet Take 1 tablet (4 mg total) by mouth every 6 (six) hours. 08/29/23  Yes Becky Augusta, NP  albuterol (VENTOLIN HFA) 108 (90 Base) MCG/ACT inhaler Inhale 1-2  puffs into the lungs every 6 (six) hours as needed for wheezing or shortness of breath. Patient not taking: Reported on 01/26/2023 11/19/19   Candis Schatz, PA-C  carisoprodol (SOMA) 350 MG tablet Take 1 tablet (350 mg total) by mouth 3 (three) times daily as needed. Patient not taking: Reported on 01/26/2023 06/07/18   Myrna Blazer, MD  empagliflozin (JARDIANCE) 10 MG TABS tablet Jardiance 10 mg tablet    [provider]  FEROSUL 325 (65 Fe) MG tablet Take 325 mg by mouth every other day. 09/01/20   [provider]  losartan (COZAAR) 100 MG tablet Take 1 tablet (100 mg total) by mouth daily. 01/26/23 04/26/23  Creig Hines, NP  medroxyPROGESTERone (DEPO-PROVERA) 150 MG/ML injection Inject into the muscle. 12/08/22   [provider]  meloxicam (MOBIC) 15 MG tablet Take 15 mg by mouth daily. 12/15/22   [provider]  RYBELSUS 7 MG TABS Take 1 tablet by mouth daily. 12/23/22   [provider]  losartan-hydrochlorothiazide (HYZAAR) 50-12.5 MG tablet Take 1 tablet by mouth daily.  11/19/19  [provider]    Family History Family History  Problem Relation Age of Onset   Heart failure Paternal Aunt    Heart failure Paternal Uncle    Heart failure Paternal Grandmother    Heart disease Paternal Grandmother    Heart murmur Paternal Grandmother  Heart disease Paternal Grandfather    Heart attack Paternal Grandfather     Social History Social History   Tobacco Use   Smoking status: Every Day    Current packs/day: 0.50    Average packs/day: 0.5 packs/day for 23.0 years (11.5 ttl pk-yrs)    Types: Cigarettes   Smokeless tobacco: Never  Vaping Use   Vaping status: Never Used  Substance Use Topics   Alcohol use: Not Currently   Drug use: No     Allergies   Augmentin [amoxicillin-pot clavulanate] and Lisinopril   Review of Systems Review of Systems  Constitutional:  Positive for fever.  HENT:  Negative for  congestion, ear pain, rhinorrhea and sore throat.   Respiratory:  Negative for cough.   Gastrointestinal:  Positive for abdominal pain and nausea. Negative for diarrhea and vomiting.  Genitourinary:  Positive for dysuria, frequency, urgency and vaginal pain. Negative for hematuria and vaginal discharge.     Physical Exam Triage Vital Signs ED Triage Vitals [08/29/23 1253]  Encounter Vitals Group     BP      Systolic BP Percentile      Diastolic BP Percentile      Pulse      Resp      Temp      Temp src      SpO2      Weight      Height      Head Circumference      Peak Flow      Pain Score 5     Pain Loc      Pain Education      Exclude from Growth Chart    No data found.  Updated Vital Signs BP 115/83 (BP Location: Left Arm)   Pulse (!) 117   Temp (!) 100.5 F (38.1 C) (Oral)   Resp 18   SpO2 94%   Visual Acuity Right Eye Distance:   Left Eye Distance:   Bilateral Distance:    Right Eye Near:   Left Eye Near:    Bilateral Near:     Physical Exam Vitals and nursing note reviewed.  Constitutional:      Appearance: Normal appearance. She is not ill-appearing.  HENT:     Head: Normocephalic and atraumatic.  Cardiovascular:     Rate and Rhythm: Normal rate and regular rhythm.     Pulses: Normal pulses.     Heart sounds: Normal heart sounds. No murmur heard.    No friction rub. No gallop.  Pulmonary:     Effort: Pulmonary effort is normal.     Breath sounds: Normal breath sounds. No wheezing, rhonchi or rales.  Abdominal:     Palpations: Abdomen is soft.     Tenderness: There is abdominal tenderness. There is no right CVA tenderness, left CVA tenderness, guarding or rebound.     Comments: Suprapubic pain without guarding.  Skin:    General: Skin is warm and dry.     Capillary Refill: Capillary refill takes less than 2 seconds.     Findings: No rash.  Neurological:     General: No focal deficit present.     Mental Status: She is alert and oriented to  person, place, and time.      UC Treatments / Results  Labs (all labs ordered are listed, but only abnormal results are displayed) Labs Reviewed  CBC WITH DIFFERENTIAL/PLATELET - Abnormal; Notable for the following components:      Result Value  RBC 6.10 (*)    Hemoglobin 15.4 (*)    HCT 48.3 (*)    MCV 79.2 (*)    MCH 25.2 (*)    Neutro Abs 7.9 (*)    All other components within normal limits  URINALYSIS, W/ REFLEX TO CULTURE (INFECTION SUSPECTED) - Abnormal; Notable for the following components:   APPearance HAZY (*)    Glucose, UA >=500 (*)    Bacteria, UA FEW (*)    All other components within normal limits  RESP PANEL BY RT-PCR (FLU A&B, COVID) ARPGX2  HIV ANTIBODY (ROUTINE TESTING W REFLEX)  RPR  CERVICOVAGINAL ANCILLARY ONLY    EKG   Radiology No results found.  Procedures Procedures (including critical care time)  Medications Ordered in UC Medications  acetaminophen (TYLENOL) tablet 975 mg (975 mg Oral Given 08/29/23 1308)    Initial Impression / Assessment and Plan / UC Course  I have reviewed the triage vital signs and the nursing notes.  Pertinent labs & imaging results that were available during my care of the patient were reviewed by me and considered in my medical decision making (see chart for details).   Patient is a pleasant, nontoxic-appearing 43 year old female presenting for evaluation of fever with associated UTI symptoms and vaginal itching as outlined HPI above.  Also complaints of suprapubic abdominal pain.  She denies any upper or lower respiratory symptoms though she is requesting to be tested for COVID and influenza.  I will order a COVID and flu PCR.  She is also concerned about possible HIV given that she has had an off-and-on fevers over the last year.  A previous sex partner, who she is following up with and whom she was having unprotected sex with, is supposed to be HIV or AIDS positive.  She would like testing for HIV.  I will order HIV  and RPR.  Additionally, she was recently on doxycycline and is concerned she has a vaginal yeast infection as she has been experiencing vaginal itching but no appreciable discharge.  She has been using Monistat without any improvement of symptoms.  I will order a vaginal cytology swab to evaluate the presence of bacterial vaginosis and yeast.  I will also test for gonorrhea, chlamydia, and trichomonas given the patient's other potential STI exposure.  Additionally, I will order urinalysis to evaluate the presence of UTI.  CBC shows an elevated RBC count of 6.10 as well as an elevated H&H of 15.4 and 48.3 respectively.  MCV and MCH are also decreased at 79.2 and 25.2 respectively.  MCHC is normal as is RDW.  Platelets normal at 315.  This is most likely secondary to patient being a smoker.  Urinalysis shows greater than 500 glucose with hazy appearance but negative leukocyte esterase, nitrates, or protein.  Reflex microscopy shows skin contamination with 11-20 squamous epithelials, 6-10 WBCs, and few bacteria.  Respiratory panel is negative for COVID or influenza.  I will discharge patient home and treat her presumptively for vaginal yeast infection given that she was recently on doxycycline and she has been experiencing vaginal itching.  I will not treat her empirically for bacterial vaginosis at this time, but rather we will wait for the results of the vaginal cytology swab.  I will prescribe Zofran that she can use every 6 hours as needed for the nausea.  She should continue to use over-the-counter Tylenol and/or ibuprofen as needed for fever or pain.   Final Clinical Impressions(s) / UC Diagnoses   Final diagnoses:  Fever, unspecified  Vaginal itching     Discharge Instructions      Your urinalysis did not show evidence of urinary tract infection and your CBC did not show elevated white blood count indicating systemic infection.  Given that you are recently on Diflucan and experiencing  vaginal itching I do suspect that you have a vaginal yeast infection.  Take 1 Diflucan tablet now and repeat dosing every 3 days for total of 3 doses.  Your vaginal swab will be back in the next 24 hours and if you test positive for any infection you will be contacted by phone and treatment options will be provided.  Additionally, your testing for HIV will also be back in the next 24 hours and if you test positive you will be referred to infectious disease for treatment.  Use over-the-counter Tylenol and/or ibuprofen as needed for fever or pain.  Use the Zofran, 4 mg every 6 hours, as needed for nausea.  If you develop any sharp increase in abdominal pain, high fevers not controlled by Tylenol or ibuprofen, nausea or vomiting you cannot keep down fluids or medication, or start vomiting blood or passing blood in your stool you need to go to the ER for evaluation.     ED Prescriptions     Medication Sig Dispense Auth. Provider   fluconazole (DIFLUCAN) 150 MG tablet Take 1 tablet (150 mg total) by mouth every 3 (three) days for 3 doses. 3 tablet Becky Augusta, NP   ondansetron (ZOFRAN) 4 MG tablet Take 1 tablet (4 mg total) by mouth every 6 (six) hours. 12 tablet Becky Augusta, NP      PDMP not reviewed this encounter.   Becky Augusta, NP 08/29/23 1356

## 2023-08-29 NOTE — ED Triage Notes (Addendum)
 Patient presents to UC for fever, chills, body aches, dysuria, weakness, abdominal pain. Reports abdominal pain last night, flu like symptoms today. Treating symptoms with ibuprofen about an hr ago. Also req HIV testing, states she has had intermittent fevers since last year, poss exposure to AIDS.

## 2023-08-30 LAB — CERVICOVAGINAL ANCILLARY ONLY
Bacterial Vaginitis (gardnerella): NEGATIVE
Candida Glabrata: NEGATIVE
Candida Vaginitis: NEGATIVE
Chlamydia: NEGATIVE
Comment: NEGATIVE
Comment: NEGATIVE
Comment: NEGATIVE
Comment: NEGATIVE
Comment: NEGATIVE
Comment: NORMAL
Neisseria Gonorrhea: NEGATIVE
Trichomonas: NEGATIVE

## 2023-08-30 LAB — RPR: RPR Ser Ql: NONREACTIVE

## 2023-09-08 ENCOUNTER — Inpatient Hospital Stay

## 2023-09-08 ENCOUNTER — Encounter: Payer: Self-pay | Admitting: Oncology

## 2023-09-08 ENCOUNTER — Inpatient Hospital Stay: Attending: Oncology | Admitting: Oncology

## 2023-09-08 VITALS — BP 134/97 | HR 74 | Temp 98.2°F | Resp 18 | Ht 68.0 in | Wt 253.8 lb

## 2023-09-08 DIAGNOSIS — D751 Secondary polycythemia: Secondary | ICD-10-CM | POA: Insufficient documentation

## 2023-09-08 DIAGNOSIS — Z85528 Personal history of other malignant neoplasm of kidney: Secondary | ICD-10-CM | POA: Diagnosis not present

## 2023-09-08 DIAGNOSIS — R790 Abnormal level of blood mineral: Secondary | ICD-10-CM | POA: Diagnosis not present

## 2023-09-08 DIAGNOSIS — F17210 Nicotine dependence, cigarettes, uncomplicated: Secondary | ICD-10-CM | POA: Insufficient documentation

## 2023-09-08 LAB — CBC (CANCER CENTER ONLY)
HCT: 44.2 % (ref 36.0–46.0)
Hemoglobin: 14 g/dL (ref 12.0–15.0)
MCH: 25.6 pg — ABNORMAL LOW (ref 26.0–34.0)
MCHC: 31.7 g/dL (ref 30.0–36.0)
MCV: 81 fL (ref 80.0–100.0)
Platelet Count: 335 10*3/uL (ref 150–400)
RBC: 5.46 MIL/uL — ABNORMAL HIGH (ref 3.87–5.11)
RDW: 14.5 % (ref 11.5–15.5)
WBC Count: 7.5 10*3/uL (ref 4.0–10.5)
nRBC: 0 % (ref 0.0–0.2)

## 2023-09-08 LAB — FERRITIN: Ferritin: 13 ng/mL (ref 11–307)

## 2023-09-08 LAB — IRON AND TIBC
Iron: 40 ug/dL (ref 28–170)
Saturation Ratios: 10 % — ABNORMAL LOW (ref 10.4–31.8)
TIBC: 399 ug/dL (ref 250–450)
UIBC: 359 ug/dL

## 2023-09-08 NOTE — Progress Notes (Signed)
 Patient has a history of kidney cancer 2 years ago. Uncle died the end of last year and had polycythemia so MD felt she needed to be checked out due to abnormal lab levels. Having a lot of dizziness and fatigue

## 2023-09-08 NOTE — Progress Notes (Signed)
 [facility]  Telephone:([phone]) [phone] Fax:([phone]) [phone]  ID: Rose Tate OB: 01-Sep-1980  MR#: [id number]  CSN#:[id number]  Patient Care Team: [facility], [facility] as PCP - General (General Practice) Debbe Odea, MD as PCP - Cardiology (Cardiology)  CHIEF COMPLAINT: Polycythemia with microcytosis.  INTERVAL HISTORY: Patient is a 43 year old female with a history of renal cell carcinoma status post cryoablation at [facility] approximately 2 years ago.  She is referred for mild polycythemia as well as microcytosis.  She currently feels well and is asymptomatic.  She has no neurologic complaints.  She denies any recent fevers or illnesses.  She has a good appetite and denies weight.  She has no chest pain, shortness of breath, cough, or hemoptysis.  She denies any nausea, vomiting, constipation, or diarrhea.  She has no urinary complaints.  Patient offers no specific complaints today.  REVIEW OF SYSTEMS:   Review of Systems  Constitutional: Negative.  Negative for fever, malaise/fatigue and weight loss.  Respiratory: Negative.  Negative for cough, hemoptysis and shortness of breath.   Cardiovascular: Negative.  Negative for chest pain and leg swelling.  Gastrointestinal: Negative.  Negative for abdominal pain.  Genitourinary: Negative.  Negative for dysuria.  Musculoskeletal: Negative.  Negative for back pain.  Skin: Negative.  Negative for rash.  Neurological: Negative.  Negative for dizziness, focal weakness, weakness and headaches.  Psychiatric/Behavioral: Negative.  The patient is not nervous/anxious.     As per HPI. Otherwise, a complete review of systems is negative.  PAST MEDICAL HISTORY: Past Medical History:  Diagnosis Date   Diabetes mellitus without complication (HCC)    pre diabetes   Hypertension    Morbid obesity (HCC)    OSA (obstructive sleep apnea)    Palpitations    a. 10/2022 Zio: Predominantly sinus rhythm at 91 (23-138).   Mobitz 1 present.  Rare PACs and PVCs.  Triggered events associated with sinus rhythm/sinus tachycardia.    PAST SURGICAL HISTORY: Past Surgical History:  Procedure Laterality Date   CESAREAN SECTION     times 3   KIDNEY SURGERY  03/18/2022    FAMILY HISTORY: Family History  Problem Relation Age of Onset   Heart failure Paternal Aunt    Heart failure Paternal Uncle    Heart failure Paternal Grandmother    Heart disease Paternal Grandmother    Heart murmur Paternal Grandmother    Heart disease Paternal Grandfather    Heart attack Paternal Grandfather     ADVANCED DIRECTIVES (Y/N):  N  HEALTH MAINTENANCE: Social History   Tobacco Use   Smoking status: Every Day    Current packs/day: 0.50    Average packs/day: 0.5 packs/day for 23.0 years (11.5 ttl pk-yrs)    Types: Cigarettes   Smokeless tobacco: Never  Vaping Use   Vaping status: Never Used  Substance Use Topics   Alcohol use: Not Currently   Drug use: No     Colonoscopy:  PAP:  Bone density:  Lipid panel:  Allergies  Allergen Reactions   Augmentin [Amoxicillin-Pot Clavulanate] Nausea And Vomiting   Lisinopril Cough    Current Outpatient Medications  Medication Sig Dispense Refill   empagliflozin (JARDIANCE) 10 MG TABS tablet Jardiance 10 mg tablet     losartan (COZAAR) 100 MG tablet Take 1 tablet (100 mg total) by mouth daily. 90 tablet 3   metFORMIN (GLUCOPHAGE-XR) 500 MG 24 hr tablet Take 500 mg by mouth daily.     Semaglutide,0.25 or 0.5MG /DOS, (OZEMPIC, 0.25 OR 0.5 MG/DOSE,)  2 MG/1.5ML SOPN Inject into the skin.     albuterol (VENTOLIN HFA) 108 (90 Base) MCG/ACT inhaler Inhale 1-2 puffs into the lungs every 6 (six) hours as needed for wheezing or shortness of breath. (Patient not taking: Reported on 09/08/2023) 8 g 0   carisoprodol (SOMA) 350 MG tablet Take 1 tablet (350 mg total) by mouth 3 (three) times daily as needed. (Patient not taking: Reported on 09/08/2023) 15 tablet 0   FEROSUL 325 (65 Fe) MG  tablet Take 325 mg by mouth every other day. (Patient not taking: Reported on 09/08/2023)     medroxyPROGESTERone (DEPO-PROVERA) 150 MG/ML injection Inject into the muscle. (Patient not taking: Reported on 09/08/2023)     meloxicam (MOBIC) 15 MG tablet Take 15 mg by mouth daily. (Patient not taking: Reported on 09/08/2023)     ondansetron (ZOFRAN) 4 MG tablet Take 1 tablet (4 mg total) by mouth every 6 (six) hours. (Patient not taking: Reported on 09/08/2023) 12 tablet 0   RYBELSUS 7 MG TABS Take 1 tablet by mouth daily. (Patient not taking: Reported on 09/08/2023)     No current facility-administered medications for this visit.    OBJECTIVE: Vitals:   09/08/23 1127  BP: (!) 134/97  Pulse: 74  Resp: 18  Temp: 98.2 F (36.8 C)  SpO2: 99%     Body mass index is 38.59 kg/m.    ECOG FS:0 - Asymptomatic  General: Well-developed, well-nourished, no acute distress. Eyes: Pink conjunctiva, anicteric sclera. HEENT: Normocephalic, moist mucous membranes. Lungs: No audible wheezing or coughing. Heart: Regular rate and rhythm. Abdomen: Soft, nontender, no obvious distention. Musculoskeletal: No edema, cyanosis, or clubbing. Neuro: Alert, answering all questions appropriately. Cranial nerves grossly intact. Skin: No rashes or petechiae noted. Psych: Normal affect. Lymphatics: No cervical, calvicular, axillary or inguinal LAD.   LAB RESULTS:  Lab Results  Component Value Date   NA 136 01/11/2023   K 3.7 01/11/2023   CL 104 01/11/2023   CO2 23 01/11/2023   GLUCOSE 217 (H) 01/11/2023   BUN 13 01/11/2023   CREATININE 0.68 01/11/2023   CALCIUM 9.0 01/11/2023   PROT 7.2 11/11/2022   ALBUMIN 4.0 11/11/2022   AST 20 11/11/2022   ALT 17 11/11/2022   ALKPHOS 85 11/11/2022   BILITOT 1.0 11/11/2022   GFRNONAA >60 01/11/2023   GFRAA >60 08/08/2018    Lab Results  Component Value Date   WBC 7.5 09/08/2023   NEUTROABS 7.9 (H) 08/29/2023   HGB 14.0 09/08/2023   HCT 44.2 09/08/2023   MCV  81.0 09/08/2023   PLT 335 09/08/2023   Lab Results  Component Value Date   IRON 40 09/08/2023   TIBC 399 09/08/2023   IRONPCTSAT 10 (L) 09/08/2023   Lab Results  Component Value Date   FERRITIN 13 09/08/2023     STUDIES: No results found.  ASSESSMENT: Polycythemia with microcytosis.  PLAN:    Polycythemia: Resolved.  Patient's hemoglobin is within normal limits at 14.0 today.  She has a mildly decreased iron saturation level, but otherwise her iron panel is within normal limits.  Erythropoietin, carbon monoxide, and JAK2 mutation with reflex are all pending at time of dictation.  No intervention is needed.  Patient will have video-assisted telemedicine visit in 3 weeks to discuss her results. Microcytosis: Resolved.  Patient's MCV is 81.0 today.  Her mildly reduced iron saturation levels may be contributing.  Have ordered hemoglobin fractionation cascade for completeness. History of renal cell carcinoma: Patient is status post cryoablation at  [facility].  Continue follow-up with imaging as directed.  I spent a total of 45 minutes reviewing chart data, face-to-face evaluation with the patient, counseling and coordination of care as detailed above.   Patient expressed understanding and was in agreement with this plan. She also understands that She can call clinic at any time with any questions, concerns, or complaints.    Jeralyn Ruths, MD   09/08/2023 3:17 PM

## 2023-09-09 LAB — CARBON MONOXIDE, BLOOD (PERFORMED AT REF LAB): Carbon Monoxide, Blood: 3.5 % (ref 0.0–3.6)

## 2023-09-09 LAB — ERYTHROPOIETIN: Erythropoietin: 14.9 m[IU]/mL (ref 2.6–18.5)

## 2023-09-13 LAB — HGB FRACTIONATION CASCADE
Hgb A2: 2.6 % (ref 1.8–3.2)
Hgb A: 97.4 % (ref 96.4–98.8)
Hgb F: 0 % (ref 0.0–2.0)
Hgb S: 0 %

## 2023-09-15 ENCOUNTER — Encounter: Payer: Self-pay | Admitting: Oncology

## 2023-09-15 LAB — JAK2 V617F RFX CALR/MPL/E12-15

## 2023-09-15 LAB — CALR +MPL + E12-E15  (REFLEX)

## 2023-09-29 ENCOUNTER — Inpatient Hospital Stay: Attending: Oncology | Admitting: Oncology

## 2023-09-29 DIAGNOSIS — D751 Secondary polycythemia: Secondary | ICD-10-CM

## 2023-09-29 NOTE — Progress Notes (Signed)
 [facility]  [phone] [phone]  ID: Rose Tate OB: 1981/05/24  MR#: [id number]  QIO#:[id number]  Patient Care Team: [facility] as PCP - General (General Practice) Constancia Delton, MD as PCP - Cardiology (Cardiology) Shellie Dials, MD as Consulting Physician (Oncology)  I connected with Rose Tate on 09/29/23 at  2:45 PM EDT by video enabled telemedicine visit and verified that I am speaking with the correct person using two identifiers.   I discussed the limitations, risks, security and privacy concerns of performing an evaluation and management service by telemedicine and the availability of in-person appointments. I also discussed with the patient that there may be a patient responsible charge related to this service. The patient expressed understanding and agreed to proceed.   Other persons participating in the visit and their role in the encounter: Patient, MD.  Patient's location: Home. Provider's location: Clinic.  CHIEF COMPLAINT: Polycythemia with microcytosis.  INTERVAL HISTORY: Patient agreed to video-assisted telemedicine visit for further evaluation and discussion of her laboratory results.  She complains of fatigue and low-grade fevers, but states her temperature is only 99 F.  She otherwise feels well.  She has no neurologic complaints.  She denies any recent fevers or illnesses.  She has a good appetite and denies weight.  She has no chest pain, shortness of breath, cough, or hemoptysis.  She denies any nausea, vomiting, constipation, or diarrhea.  She has no urinary complaints.  Patient offers no further specific complaints today.  REVIEW OF SYSTEMS:   Review of Systems  Constitutional:  Positive for malaise/fatigue. Negative for fever and weight loss.  Respiratory: Negative.  Negative for cough, hemoptysis and shortness of breath.   Cardiovascular: Negative.  Negative for chest  pain and leg swelling.  Gastrointestinal: Negative.  Negative for abdominal pain.  Genitourinary: Negative.  Negative for dysuria.  Musculoskeletal: Negative.  Negative for back pain.  Skin: Negative.  Negative for rash.  Neurological: Negative.  Negative for dizziness, focal weakness, weakness and headaches.  Psychiatric/Behavioral: Negative.  The patient is not nervous/anxious.     As per HPI. Otherwise, a complete review of systems is negative.  PAST MEDICAL HISTORY: Past Medical History:  Diagnosis Date   Diabetes mellitus without complication (HCC)    pre diabetes   Hypertension    Morbid obesity (HCC)    OSA (obstructive sleep apnea)    Palpitations    a. 10/2022 Zio: Predominantly sinus rhythm at 91 (23-138).  Mobitz 1 present.  Rare PACs and PVCs.  Triggered events associated with sinus rhythm/sinus tachycardia.    PAST SURGICAL HISTORY: Past Surgical History:  Procedure Laterality Date   CESAREAN SECTION     times 3   KIDNEY SURGERY  03/18/2022    FAMILY HISTORY: Family History  Problem Relation Age of Onset   Heart failure Paternal Aunt    Heart failure Paternal Uncle    Heart failure Paternal Grandmother    Heart disease Paternal Grandmother    Heart murmur Paternal Grandmother    Heart disease Paternal Grandfather    Heart attack Paternal Grandfather     ADVANCED DIRECTIVES (Y/N):  N  HEALTH MAINTENANCE: Social History   Tobacco Use   Smoking status: Every Day    Current packs/day: 0.50    Average packs/day: 0.5 packs/day for 23.0 years (11.5 ttl pk-yrs)    Types: Cigarettes   Smokeless tobacco: Never  Vaping Use   Vaping status: Never Used  Substance Use  Topics   Alcohol use: Not Currently   Drug use: No     Colonoscopy:  PAP:  Bone density:  Lipid panel:  Allergies  Allergen Reactions   Augmentin [Amoxicillin-Pot Clavulanate] Nausea And Vomiting   Lisinopril Cough    Current Outpatient Medications  Medication Sig Dispense Refill    albuterol  (VENTOLIN  HFA) 108 (90 Base) MCG/ACT inhaler Inhale 1-2 puffs into the lungs every 6 (six) hours as needed for wheezing or shortness of breath. (Patient not taking: Reported on 09/08/2023) 8 g 0   carisoprodol  (SOMA ) 350 MG tablet Take 1 tablet (350 mg total) by mouth 3 (three) times daily as needed. (Patient not taking: Reported on 09/08/2023) 15 tablet 0   empagliflozin (JARDIANCE) 10 MG TABS tablet Jardiance 10 mg tablet     FEROSUL 325 (65 Fe) MG tablet Take 325 mg by mouth every other day. (Patient not taking: Reported on 09/08/2023)     losartan  (COZAAR ) 100 MG tablet Take 1 tablet (100 mg total) by mouth daily. 90 tablet 3   medroxyPROGESTERone (DEPO-PROVERA) 150 MG/ML injection Inject into the muscle. (Patient not taking: Reported on 09/08/2023)     meloxicam (MOBIC) 15 MG tablet Take 15 mg by mouth daily. (Patient not taking: Reported on 09/08/2023)     metFORMIN (GLUCOPHAGE-XR) 500 MG 24 hr tablet Take 500 mg by mouth daily.     ondansetron  (ZOFRAN ) 4 MG tablet Take 1 tablet (4 mg total) by mouth every 6 (six) hours. (Patient not taking: Reported on 09/08/2023) 12 tablet 0   RYBELSUS 7 MG TABS Take 1 tablet by mouth daily. (Patient not taking: Reported on 09/08/2023)     Semaglutide,0.25 or 0.5MG /DOS, (OZEMPIC, 0.25 OR 0.5 MG/DOSE,) 2 MG/1.5ML SOPN Inject into the skin.     No current facility-administered medications for this visit.    OBJECTIVE: There were no vitals filed for this visit.    There is no height or weight on file to calculate BMI.    ECOG FS:0 - Asymptomatic  General: Well-developed, well-nourished, no acute distress. HEENT: Normocephalic. Neuro: Alert, answering all questions appropriately. Cranial nerves grossly intact. Psych: Normal affect.  LAB RESULTS:  Lab Results  Component Value Date   NA 136 01/11/2023   K 3.7 01/11/2023   CL 104 01/11/2023   CO2 23 01/11/2023   GLUCOSE 217 (H) 01/11/2023   BUN 13 01/11/2023   CREATININE 0.68 01/11/2023    CALCIUM 9.0 01/11/2023   PROT 7.2 11/11/2022   ALBUMIN 4.0 11/11/2022   AST 20 11/11/2022   ALT 17 11/11/2022   ALKPHOS 85 11/11/2022   BILITOT 1.0 11/11/2022   GFRNONAA >60 01/11/2023   GFRAA >60 08/08/2018    Lab Results  Component Value Date   WBC 7.5 09/08/2023   NEUTROABS 7.9 (H) 08/29/2023   HGB 14.0 09/08/2023   HCT 44.2 09/08/2023   MCV 81.0 09/08/2023   PLT 335 09/08/2023   Lab Results  Component Value Date   IRON 40 09/08/2023   TIBC 399 09/08/2023   IRONPCTSAT 10 (L) 09/08/2023   Lab Results  Component Value Date   FERRITIN 13 09/08/2023     STUDIES: No results found.  ASSESSMENT: Polycythemia with microcytosis.  PLAN:    Polycythemia: Resolved.  Patient's hemoglobin is within normal limits at 14.0 today.  She has a mildly decreased iron saturation level, but otherwise her iron panel is within normal limits.  Erythropoietin , carbon monoxide, and JAK2 mutation with reflex are all either negative or within normal limits.  No intervention is needed.  No further follow-up is necessary.   Microcytosis: Resolved.  Patient's MCV is 81.0.  Her mildly reduced iron saturation levels may be contributing.   Hemoglobin fractionation cascade is normal.  I have instructed patient to reinitiate oral iron supplementation. History of renal cell carcinoma: Patient is status post cryoablation at [facility].  Continue follow-up with [facility] as scheduled. Fatigue: Recommended patient follow-up with her primary care physician if persists.  I provided 20 minutes of face-to-face video visit time during this encounter which included chart review, counseling, and coordination of care as documented above.   Patient expressed understanding and was in agreement with this plan. She also understands that She can call clinic at any time with any questions, concerns, or complaints.    Shellie Dials, MD   09/29/2023 4:06 PM

## 2023-10-01 IMAGING — CT CT ABD-PELV W/ CM
2 of 4 series · 16 of 46 positions shown, 18 images · IV contrast (APPLIED)
Comparison: 06/07/2018

CLINICAL DATA: Right lower quadrant abdominal pain, body aches,
dysuria history of frequent UTIs

EXAM:
CT ABDOMEN AND PELVIS WITH CONTRAST
TECHNIQUE: Multidetector CT imaging of the abdomen and pelvis was performed
using the standard protocol following bolus administration of
intravenous contrast.

[Series 2: routine abd/pel with · axial · 0.98mm/px · z∈[-531,-46]mm · 13 of 107 slices shown, 15 images]
[im 5/107  soft-tissue]
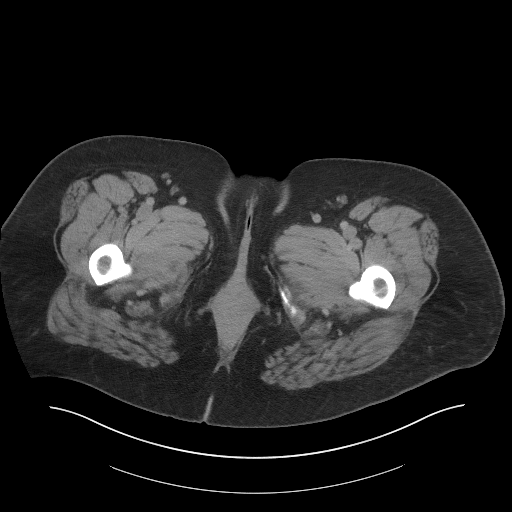
[im 5/107  bone]
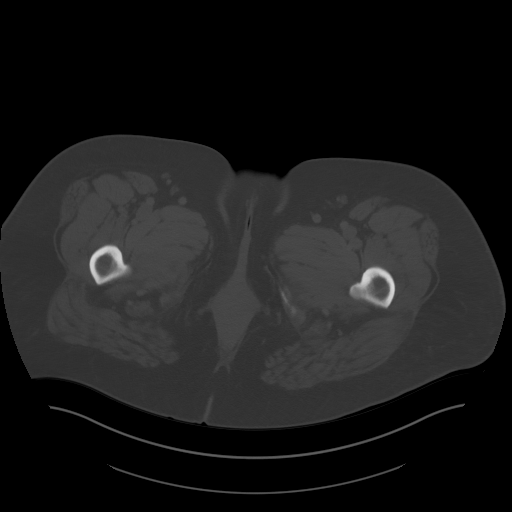
[im 14/107  soft-tissue]
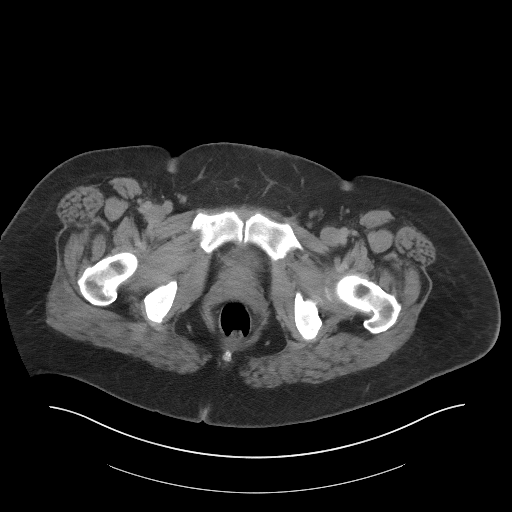
[im 23/107  soft-tissue]
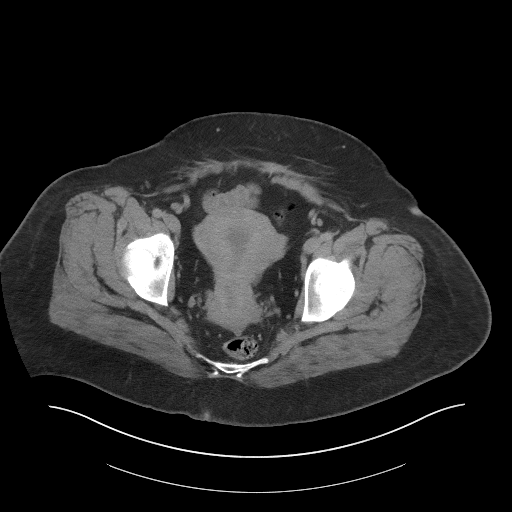
[im 31/107  soft-tissue]
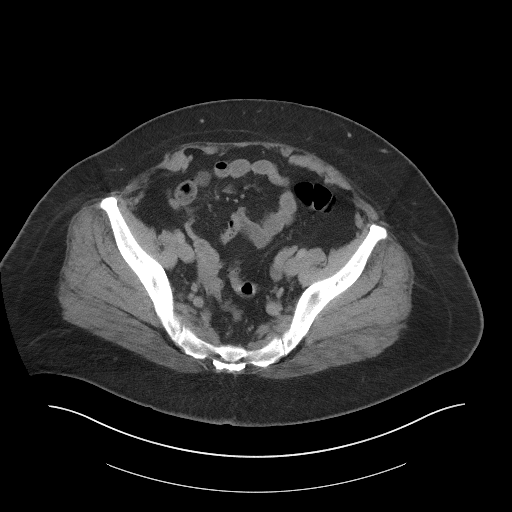
[im 36/107  soft-tissue]
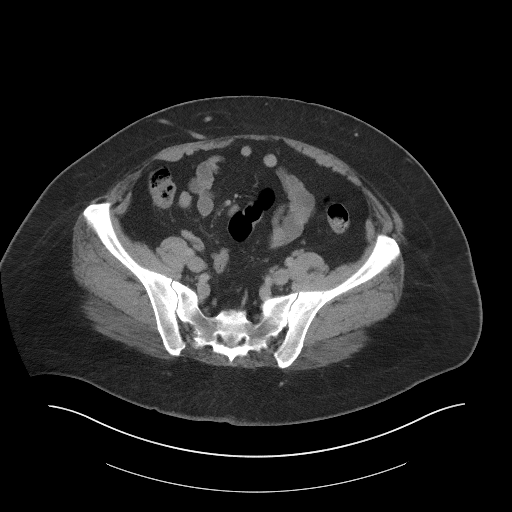
[im 45/107  soft-tissue]
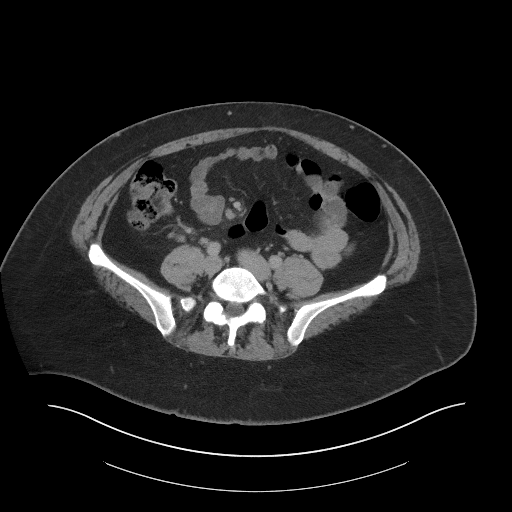
[im 54/107  soft-tissue]
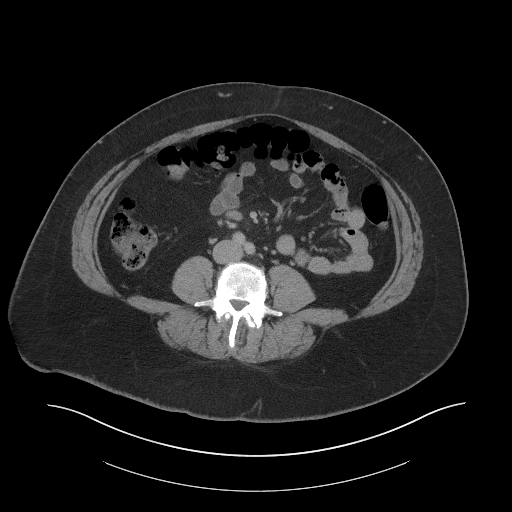
[im 62/107  soft-tissue]
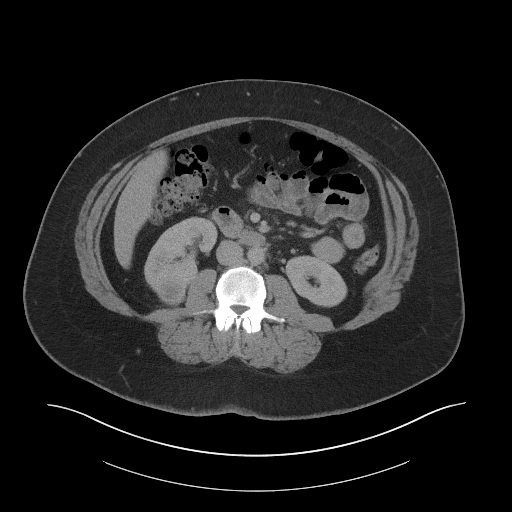
[im 71/107  soft-tissue]
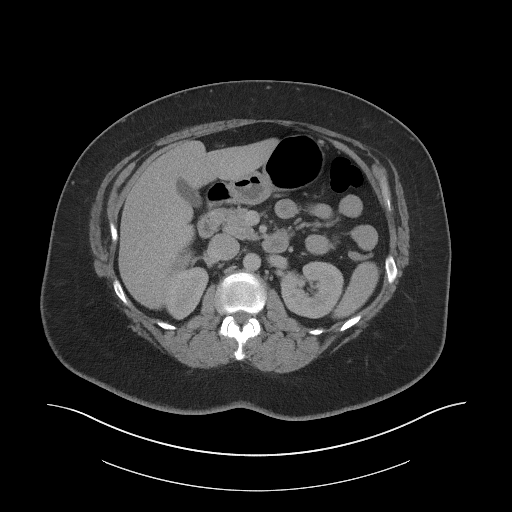
[im 71/107  bone]
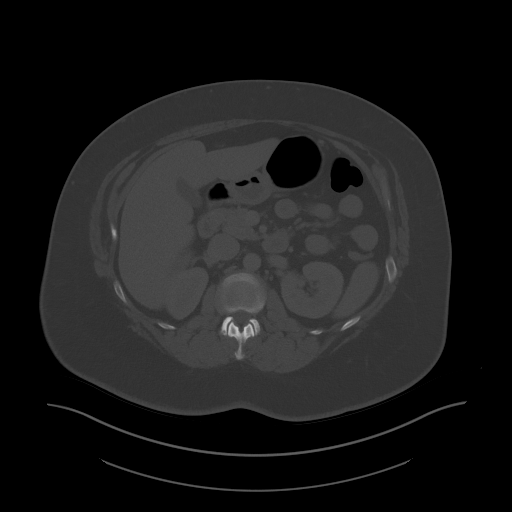
[im 76/107  soft-tissue]
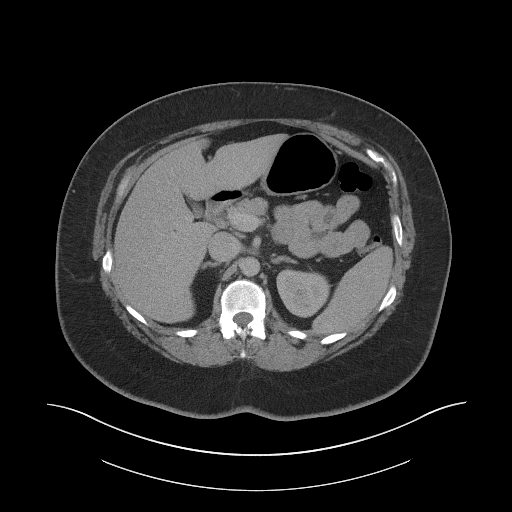
[im 84/107  soft-tissue]
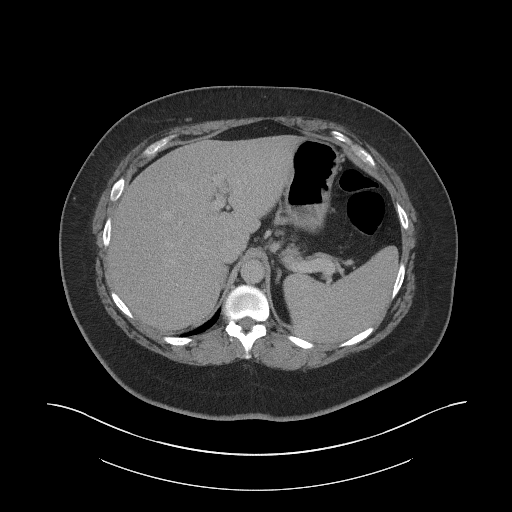
[im 93/107  soft-tissue]
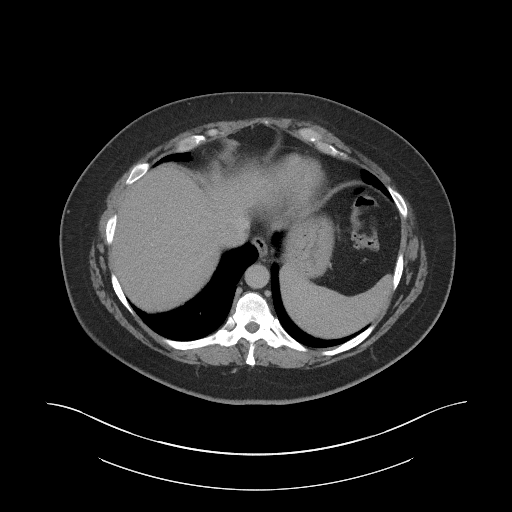
[im 102/107  soft-tissue]
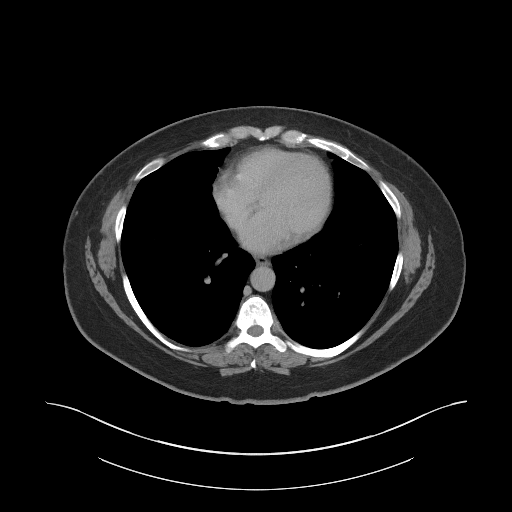

[Series 5: coronal st · coronal · 0.97mm/px · 3 of 115 slices shown]
[im 39/115  soft-tissue]
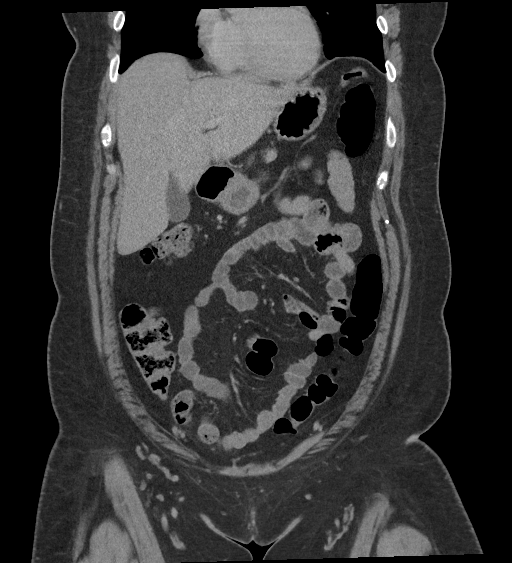
[im 51/115  soft-tissue]
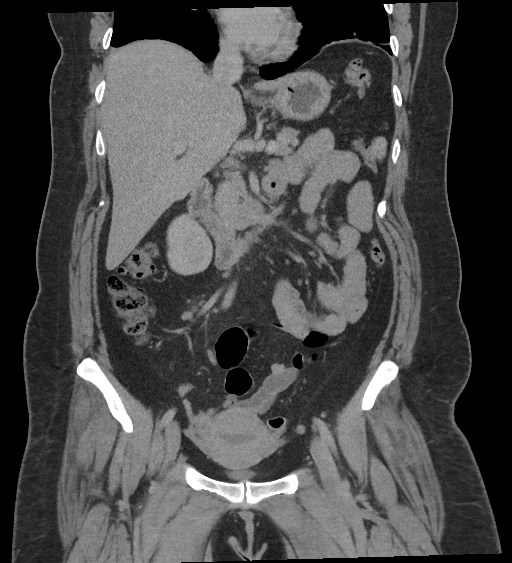
[im 64/115  soft-tissue]
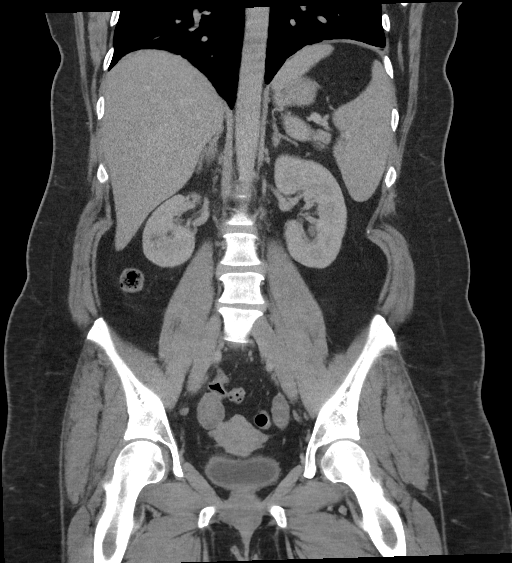

[16 of 46 positions shown; findings below may reference images not displayed]

RADIATION DOSE REDUCTION: This exam was performed according to the
departmental dose-optimization program which includes automated
exposure control, adjustment of the mA and/or kV according to
patient size and/or use of iterative reconstruction technique.

CONTRAST:  100mL OMNIPAQUE IOHEXOL 300 MG/ML  SOLN
FINDINGS: Lower chest: Lung bases are clear.

Hepatobiliary: Liver is within normal limits.

Gallbladder is unremarkable. No intrahepatic or extrahepatic duct
dilatation.

Pancreas: Within normal limits.

Spleen: Within normal limits.

Adrenals/Urinary Tract: Adrenal glands are within normal limits.

2.8 cm lesion in the posterior right lower kidney (series 2/image
47) which is new from the prior and does not have the appearance of
a simple cyst, raising concern for a solid renal neoplasm.

Left kidney is within normal limits.  No hydronephrosis.

Bladder is mildly thick-walled although underdistended.

Stomach/Bowel: Stomach is within normal limits.

No evidence of bowel obstruction.

Normal appendix (series 2/image 77).

No colonic wall thickening or inflammatory changes.

Vascular/Lymphatic: No evidence of abdominal aortic aneurysm.

No suspicious abdominopelvic lymphadenopathy.

Reproductive: Uterus is within normal limits.

Bilateral ovaries are within normal limits.

Other: No abdominopelvic ascites.

Musculoskeletal: Visualized osseous structures are within normal
limits.
IMPRESSION: No evidence of bowel obstruction. Normal appendix. No CT findings to
account for the patient's right lower quadrant abdominal pain.

2.8 cm lesion in the posterior right lower kidney, new from the
prior, raising concern for a solid renal neoplasm. Outpatient
urology referral is suggested. Consider follow-up MRI abdomen
with/without contrast in 4-6 weeks.

## 2023-11-10 ENCOUNTER — Ambulatory Visit
Admission: RE | Admit: 2023-11-10 | Discharge: 2023-11-10 | Disposition: A | Discharge status: Home / Self Care | Source: Ambulatory Visit | Attending: Primary Care | Admitting: Primary Care

## 2023-11-10 DIAGNOSIS — Z1231 Encounter for screening mammogram for malignant neoplasm of breast: Secondary | ICD-10-CM | POA: Diagnosis present

## 2023-11-15 ENCOUNTER — Other Ambulatory Visit: Payer: Self-pay | Admitting: Primary Care

## 2023-11-15 DIAGNOSIS — R928 Other abnormal and inconclusive findings on diagnostic imaging of breast: Secondary | ICD-10-CM

## 2023-11-22 ENCOUNTER — Ambulatory Visit
Admission: RE | Admit: 2023-11-22 | Discharge: 2023-11-22 | Disposition: A | Discharge status: Home / Self Care | Source: Ambulatory Visit | Attending: Primary Care | Admitting: Primary Care

## 2023-11-22 DIAGNOSIS — R928 Other abnormal and inconclusive findings on diagnostic imaging of breast: Secondary | ICD-10-CM | POA: Insufficient documentation
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
## 2024-01-02 ENCOUNTER — Ambulatory Visit: Payer: Self-pay | Admitting: Internal Medicine

## 2024-01-02 VITALS — BP 126/92 | HR 80 | Resp 16 | Ht 67.0 in | Wt 255.0 lb

## 2024-01-02 DIAGNOSIS — I10 Essential (primary) hypertension: Secondary | ICD-10-CM | POA: Diagnosis not present

## 2024-01-02 DIAGNOSIS — E669 Obesity, unspecified: Secondary | ICD-10-CM

## 2024-01-02 DIAGNOSIS — G4733 Obstructive sleep apnea (adult) (pediatric): Secondary | ICD-10-CM | POA: Diagnosis not present

## 2024-01-02 NOTE — Patient Instructions (Signed)
 Living With Sleep Apnea Sleep apnea is a condition that affects your breathing while you're sleeping. Your tongue or the tissue in your throat may block the flow of air while you sleep. You may have shallow breathing or stop breathing for short periods of time. The breaks in breathing interrupt the deep sleep that you need to feel rested. Even if you don't wake up from the gaps in breathing, you may feel tired during the day. People with sleep apnea may snore loudly. You may have a headache in the morning and feel anxious or depressed. How can sleep apnea affect me? Sleep apnea increases your chances of being very tired during the day. This is called daytime fatigue. Sleep apnea can also increase your risk of: Heart attack. Stroke. Obesity. Type 2 diabetes. Heart failure. Irregular heartbeat. High blood pressure. If you are very tired during the day, you may be more likely to: Not do well in school or at work. Fall asleep while driving. Have trouble paying attention. Develop depression or anxiety. Have problems having sex. This is called sexual dysfunction. What actions can I take to manage sleep apnea? Sleep apnea treatment  If you were given a device to open your airway while you sleep, use it only as told by your health care provider. You may be given: An oral appliance. This is a mouthpiece that shifts your lower jaw forward. A continuous positive airway pressure (CPAP) device. This blows air through a mask. A nasal expiratory positive airway pressure (EPAP) device. This has valves that you put into each nostril. A bi-level positive airway pressure (BIPAP) device. This blows air through a mask when you breathe in and breathe out. You may need surgery if other treatments don't work for you. Sleep habits Go to sleep and wake up at the same time every day. This helps set your internal clock for sleeping. If you stay up later than usual on weekends, try to get up in the morning within 2  hours of the time you usually wake up. Try to get at least 7-9 hours of sleep each night. Stop using a computer, tablet, and mobile phone a few hours before bedtime. Do not take long naps during the day. If you nap, limit it to 30 minutes. Have a relaxing bedtime routine. Reading or listening to music may relax you and help you sleep. Use your bedroom only for sleep. Keep your television and computer out of your bedroom. Keep your bedroom cool, dark, and quiet. Use a supportive mattress and pillows. Follow your provider's instructions for other changes to sleep habits. Nutrition Do not eat big meals in the evening. Do not have caffeine in the later part of the day. The effects of caffeine can last for more than 5 hours. Follow your provider's instructions for any changes to what you eat and drink. Lifestyle Do not drink alcohol before bedtime. Alcohol can cause you to fall asleep at first, but then it can cause you to wake up in the middle of the night and have trouble getting back to sleep. Do not smoke, vape, or use nicotine or tobacco. Medicines Take over-the-counter and prescription medicines only as told by your provider. Do not use over-the-counter sleep medicine. You may become dependent on this medicine, and it can make sleep apnea worse. Do not take medicines, such as sedatives and narcotics, unless told to by your provider. Activity Exercise on most days, but avoid exercising in the evening. Exercising near bedtime can interfere with sleeping.  If possible, spend time outside every day. Natural light helps with your internal clock. General information Lose weight if you need to. Stay at a healthy weight. If you are having surgery, make sure to tell your provider that you have sleep apnea. You may need to bring your device with you. Keep all follow-up visits. Your provider will want to check on your condition. Where to find more information National [employer], [employer], and [employer]  In[employer]: [employer] This information is not intended to replace advice given to you by your health care provider. Make sure you discuss any questions you have with your health care provider. Document Revised: 09/08/2022 Document Reviewed: 09/08/2022 Elsevier Patient Education  2024 [employer].

## 2024-01-05 ENCOUNTER — Ambulatory Visit: Admitting: Nurse Practitioner

## 2024-01-06 ENCOUNTER — Ambulatory Visit: Admitting: Nurse Practitioner

## 2024-01-28 ENCOUNTER — Other Ambulatory Visit: Payer: Self-pay | Admitting: Medical Genetics

## 2024-02-03 ENCOUNTER — Ambulatory Visit: Admitting: Nurse Practitioner

## 2024-02-07 ENCOUNTER — Other Ambulatory Visit

## 2024-02-16 ENCOUNTER — Other Ambulatory Visit
Admission: RE | Admit: 2024-02-16 | Discharge: 2024-02-16 | Disposition: A | Discharge status: Home / Self Care | Payer: Self-pay | Source: Ambulatory Visit | Attending: Medical Genetics | Admitting: Medical Genetics

## 2024-02-26 LAB — GENECONNECT MOLECULAR SCREEN: Genetic Analysis Overall Interpretation: NEGATIVE

## 2024-04-17 ENCOUNTER — Emergency Department
Admission: EM | Admit: 2024-04-17 | Discharge: 2024-04-17 | Disposition: A | Discharge status: Home / Self Care | Attending: Emergency Medicine | Admitting: Emergency Medicine

## 2024-04-17 ENCOUNTER — Other Ambulatory Visit: Payer: Self-pay

## 2024-04-17 ENCOUNTER — Emergency Department

## 2024-04-17 ENCOUNTER — Encounter: Payer: Self-pay | Admitting: Emergency Medicine

## 2024-04-17 DIAGNOSIS — E119 Type 2 diabetes mellitus without complications: Secondary | ICD-10-CM | POA: Insufficient documentation

## 2024-04-17 DIAGNOSIS — R1030 Lower abdominal pain, unspecified: Secondary | ICD-10-CM | POA: Diagnosis present

## 2024-04-17 DIAGNOSIS — K5900 Constipation, unspecified: Secondary | ICD-10-CM | POA: Insufficient documentation

## 2024-04-17 DIAGNOSIS — I10 Essential (primary) hypertension: Secondary | ICD-10-CM | POA: Diagnosis not present

## 2024-04-17 LAB — CBC
HCT: 46.3 % — ABNORMAL HIGH (ref 36.0–46.0)
Hemoglobin: 14.7 g/dL (ref 12.0–15.0)
MCH: 26.3 pg (ref 26.0–34.0)
MCHC: 31.7 g/dL (ref 30.0–36.0)
MCV: 83 fL (ref 80.0–100.0)
Platelets: 296 K/uL (ref 150–400)
RBC: 5.58 MIL/uL — ABNORMAL HIGH (ref 3.87–5.11)
RDW: 13.7 % (ref 11.5–15.5)
WBC: 6.2 K/uL (ref 4.0–10.5)
nRBC: 0 % (ref 0.0–0.2)

## 2024-04-17 LAB — COMPREHENSIVE METABOLIC PANEL WITH GFR
ALT: 14 U/L (ref 0–44)
AST: 15 U/L (ref 15–41)
Albumin: 4.3 g/dL (ref 3.5–5.0)
Alkaline Phosphatase: 74 U/L (ref 38–126)
Anion gap: 11 (ref 5–15)
BUN: 10 mg/dL (ref 6–20)
CO2: 23 mmol/L (ref 22–32)
Calcium: 9.1 mg/dL (ref 8.9–10.3)
Chloride: 103 mmol/L (ref 98–111)
Creatinine, Ser: 0.76 mg/dL (ref 0.44–1.00)
GFR, Estimated: >60 mL/min (ref >60)
Glucose, Bld: 171 mg/dL — ABNORMAL HIGH (ref 70–99)
Potassium: 4.4 mmol/L (ref 3.5–5.1)
Sodium: 138 mmol/L (ref 135–145)
Total Bilirubin: 0.7 mg/dL (ref 0.0–1.2)
Total Protein: 7.1 g/dL (ref 6.5–8.1)

## 2024-04-17 LAB — URINALYSIS, ROUTINE W REFLEX MICROSCOPIC
Bilirubin Urine: NEGATIVE
Glucose, UA: >=500 mg/dL — AB
Hgb urine dipstick: NEGATIVE
Ketones, ur: NEGATIVE mg/dL
Leukocytes,Ua: NEGATIVE
Nitrite: NEGATIVE
Protein, ur: NEGATIVE mg/dL
Specific Gravity, Urine: 1.03 (ref 1.005–1.030)
pH: 6 (ref 5.0–8.0)

## 2024-04-17 LAB — POC URINE PREG, ED: Preg Test, Ur: NEGATIVE

## 2024-04-17 LAB — LIPASE, BLOOD: Lipase: 23 U/L (ref 11–51)

## 2024-04-17 MED ORDER — POLYETHYLENE GLYCOL 3350 17 GM/SCOOP PO POWD: 17.0000 g | Freq: Every day | ORAL | 0 refills | Status: AC

## 2024-04-17 MED ORDER — IOHEXOL 300 MG/ML  SOLN
100.0000 mL | Freq: Once | INTRAMUSCULAR | Status: AC | PRN
  Administered 2024-04-17: 100 mL via INTRAVENOUS

## 2024-04-17 NOTE — ED Triage Notes (Signed)
 Pt had spinal pain injection (epidural steroid) last Monday. Pt reports difficulty having a BM since then. Pt had BM on Friday. Pt took 3 doses of miralax, 2 chocolate laxatives and stool softeners and had another BM today. Pt reports it was a small BM today. Pt c/o bloating/pain, headaches, fatigue and nausea. Pt normally has BM 1-2x daily.

## 2024-04-17 NOTE — Discharge Instructions (Addendum)
 Please use MiraLAX one half capful every hour until your first bowel movement.  Please do not take any MiraLAX after this for at least 24 hours.  You may use one half capful twice a day of MiraLAX in order to have 1 solid well-formed bowel movement per day.  You may increase or decrease this dosage as needed to obtain this 1 well-formed bowel movement.  Please make sure that you are drinking at least 8 ounces of water every hour during this initial bowel regimen. ?

## 2024-04-17 NOTE — ED Provider Notes (Signed)
 [facility] Provider Note    Event Date/Time   First MD Initiated Contact with Patient 04/17/24 1249     (approximate)   History   Constipation and Abdominal Pain   HPI  Rose Tate is a 43 year old with history of diabetes, hypertension presenting to the emergency department for evaluation of abdominal pain.  Patient reports that over the last week she has had infrequent bowel movements.  Last bowel movement was this morning, but reports smaller than usual.  Denies any decrease sensation while having her bowel movement.  No bowel or bladder incontinence.  No issues with urinary retention.  No fevers.  Does report lower abdominal pain.  Does additionally note that she recently was on Ozempic and having issues with constipation and diarrhea, more recently switched to Trulicity.  Does note that she had a epidural pain injection last Monday.  No new numbness, tingling, focal weakness.    Physical Exam   Triage Vital Signs: ED Triage Vitals  Encounter Vitals Group     BP 04/17/24 1224 (!) 149/97     Girls Systolic BP Percentile --      Girls Diastolic BP Percentile --      Boys Systolic BP Percentile --      Boys Diastolic BP Percentile --      Pulse Rate 04/17/24 1224 78     Resp 04/17/24 1244 17     Temp 04/17/24 1224 98.3 F (36.8 C)     Temp Source 04/17/24 1224 Oral     SpO2 04/17/24 1224 99 %     Weight 04/17/24 1224 263 lb (119.3 kg)     Height 04/17/24 1224 5' 7 (1.702 m)     Head Circumference --      Peak Flow --      Pain Score 04/17/24 1238 5     Pain Loc --      Pain Education --      Exclude from Growth Chart --     Most recent vital signs: Vitals:   04/17/24 1224 04/17/24 1244  BP: (!) 149/97   Pulse: 78   Resp:  17  Temp: 98.3 F (36.8 C)   SpO2: 99%      General: Awake, interactive  CV:  Good peripheral perfusion Resp:  Unlabored respirations Abd:  Nondistended, soft, tender to palpation of the lower abdomen  without rebound or guarding Neuro:  Symmetric facial movement, fluid speech, 5 out of 5 strength of bilateral upper and lower extremities, good gluteal squeeze   ED Results / Procedures / Treatments   Labs (all labs ordered are listed, but only abnormal results are displayed) Labs Reviewed  COMPREHENSIVE METABOLIC PANEL WITH GFR - Abnormal; Notable for the following components:      Result Value   Glucose, Bld 171 (*)    All other components within normal limits  CBC - Abnormal; Notable for the following components:   RBC 5.58 (*)    HCT 46.3 (*)    All other components within normal limits  URINALYSIS, ROUTINE W REFLEX MICROSCOPIC - Abnormal; Notable for the following components:   Color, Urine YELLOW (*)    APPearance CLEAR (*)    Glucose, UA >=500 (*)    Bacteria, UA RARE (*)    All other components within normal limits  LIPASE, BLOOD  POC URINE PREG, ED     EKG EKG independently reviewed and interpreted by myself demonstrates:    RADIOLOGY Imaging independently reviewed  and interpreted by myself demonstrates:   Formal Radiology Read:  No results found.  PROCEDURES:  Critical Care performed: No  Procedures   MEDICATIONS ORDERED IN ED: Medications  iohexol  (OMNIPAQUE ) 300 MG/ML solution 100 mL (100 mLs Intravenous Contrast Given 04/17/24 1458)     IMPRESSION / MDM / ASSESSMENT AND PLAN / ED COURSE  I reviewed the triage vital signs and the nursing notes.  Differential diagnosis includes, but is not limited to, constipation, bowel obstruction, colitis, diverticulitis, other acute intra-abdominal process  Patient's presentation is most consistent with acute presentation with potential threat to life or bodily function.  43 year old female presenting to the emergency department for evaluation of lower abdominal pain and decreased bowel movements.  Recent epidural injection, but denies numbness in perianal area, incontinence, and is having bowel movements that  she can feel.  Does have lower abdominal tenderness on exam.  Will obtain CT to further evaluate.  Did have recent lumbar steroid injection but no incontinence, perineal numbness, new lower extremity numbness, tingling, focal weakness.  Overall lower suspicion acute spinal cord pathology.  Postvoid residual ordered.  Signed out to oncoming physician at 1510 pending CT, reevaluation, and disposition.    FINAL CLINICAL IMPRESSION(S) / ED DIAGNOSES   Final diagnoses:  Lower abdominal pain  Constipation, unspecified constipation type     Rx / DC Orders   ED Discharge Orders     None        Note:  This document was prepared using Dragon voice recognition software and may include unintentional dictation errors.   Levander Slate, MD 04/17/24 [phone]

## 2024-05-18 NOTE — Progress Notes (Unsigned)
 [facility] [address] [place], [place] [address]  Pulmonary Sleep Medicine   Office Visit Note  Patient Name: Rose Tate DOB: 07-May-1981 MRN [id number]    Chief Complaint: Obstructive Sleep Apnea visit  Brief History:  Rose Tate is seen today for a follow up visit for APAP@ 6-12 cmH2O. The patient has a 15 year history of sleep apnea. Patient is using PAP nightly.  The patient feels rested after sleeping with PAP.  The patient reports benefiting from PAP use. Reported sleepiness is improved and the Epworth Sleepiness Score is 5 out of 24. The patient does not take naps. The patient complains of the following:   The compliance download shows 73% compliance with an average use time of 5 hours 46 minutes. The AHI is 1.0. The patient does complain of limb movements disrupting sleep. The patient continues to require PAP therapy in order to eliminate sleep apnea.  ROS  General: (-) fever, (-) chills, (-) night sweat Nose and Sinuses: (-) nasal stuffiness or itchiness, (-) postnasal drip, (-) nosebleeds, (-) sinus trouble. Mouth and Throat: (-) sore throat, (-) hoarseness. Neck: (-) swollen glands, (-) enlarged thyroid, (-) neck pain. Respiratory: - cough, - shortness of breath, - wheezing. Neurologic: + numbness, + tingling. Psychiatric: + anxiety, + depression   Current Medication: Outpatient Encounter Medications as of 05/21/2024  Medication Sig   TRULICITY 4.5 MG/0.5ML SOAJ TRULICITY 4.5 MG/0.5ML SOAJ   albuterol  (VENTOLIN  HFA) 108 (90 Base) MCG/ACT inhaler Inhale 1-2 puffs into the lungs every 6 (six) hours as needed for wheezing or shortness of breath. (Patient not taking: Reported on 09/08/2023)   empagliflozin (JARDIANCE) 10 MG TABS tablet Jardiance 10 mg tablet   FEROSUL 325 (65 Fe) MG tablet Take 325 mg by mouth every other day. (Patient not taking: Reported on 09/08/2023)   gabapentin (NEURONTIN) 100 MG capsule GABAPENTIN 100 MG CAPS   JARDIANCE 25 MG  TABS tablet Take 25 mg by mouth daily.   losartan  (COZAAR ) 100 MG tablet Take 1 tablet (100 mg total) by mouth daily.   medroxyPROGESTERone (DEPO-PROVERA) 150 MG/ML injection Inject into the muscle. (Patient not taking: Reported on 09/08/2023)   meloxicam (MOBIC) 15 MG tablet Take 15 mg by mouth daily. (Patient not taking: Reported on 09/08/2023)   metFORMIN (GLUCOPHAGE-XR) 500 MG 24 hr tablet Take 500 mg by mouth daily.   omeprazole (PRILOSEC) 20 MG capsule Take 20 mg by mouth daily.   ondansetron  (ZOFRAN ) 4 MG tablet Take 1 tablet (4 mg total) by mouth every 6 (six) hours. (Patient not taking: Reported on 09/08/2023)   ondansetron  (ZOFRAN -ODT) 8 MG disintegrating tablet ONDANSETRON  8 MG TBDP   polyethylene glycol powder (GLYCOLAX /MIRALAX ) 17 GM/SCOOP powder Take 17 g by mouth daily. Dissolve 1 capful (17g) in 4-8 ounces of liquid and take by mouth daily.   [DISCONTINUED] losartan -hydrochlorothiazide  (HYZAAR) 50-12.5 MG tablet Take 1 tablet by mouth daily.   [DISCONTINUED] OZEMPIC, 1 MG/DOSE, 4 MG/3ML SOPN OZEMPIC (1 MG/DOSE) 4 MG/3ML SOPN   No facility-administered encounter medications on file as of 05/21/2024.    Surgical History: Past Surgical History:  Procedure Laterality Date   CESAREAN SECTION     times 3   KIDNEY SURGERY  03/18/2022    Medical History: Past Medical History:  Diagnosis Date   Diabetes mellitus without complication (HCC)    pre diabetes   Hypertension    Morbid obesity (HCC)    OSA (obstructive sleep apnea)    Palpitations    a. 10/2022 Zio:  Predominantly sinus rhythm at 91 (23-138).  Mobitz 1 present.  Rare PACs and PVCs.  Triggered events associated with sinus rhythm/sinus tachycardia.    Family History: Non contributory to the present illness  Social History: Social History   Socioeconomic History   Marital status: Married    Spouse name: Not on file   Number of children: Not on file   Years of education: Not on file   Highest education level: Not  on file  Occupational History   Not on file  Tobacco Use   Smoking status: Former    Current packs/day: 0.50    Average packs/day: 0.5 packs/day for 23.0 years (11.5 ttl pk-yrs)    Types: Cigarettes, E-cigarettes   Smokeless tobacco: Never  Vaping Use   Vaping status: Never Used  Substance and Sexual Activity   Alcohol use: Not Currently   Drug use: No   Sexual activity: Yes  Other Topics Concern   Not on file  Social History Narrative   Not on file   Social Drivers of Health   Tobacco Use: Medium Risk (05/21/2024)   Patient History    Smoking Tobacco Use: Former    Smokeless Tobacco Use: Never    Passive Exposure: Not on Actuary Strain: Not on file  Food Insecurity: Food Insecurity Present (09/08/2023)   Hunger Vital Sign    Worried About Programme Researcher, Broadcasting/film/video in the Last Year: Sometimes true    Ran Out of Food in the Last Year: Sometimes true  Transportation Needs: Not on file  Physical Activity: Not on file  Stress: Not on file  Social Connections: Not on file  Intimate Partner Violence: Not At Risk (09/08/2023)   Humiliation, Afraid, Rape, and Kick questionnaire    Fear of Current or Ex-Partner: No    Emotionally Abused: No    Physically Abused: No    Sexually Abused: No  Depression (PHQ2-9): Low Risk (09/08/2023)   Depression (PHQ2-9)    PHQ-2 Score: 1  Alcohol Screen: Not on file  Housing: Unknown (09/08/2023)   Housing Stability Vital Sign    Unable to Pay for Housing in the Last Year: No    Number of Times Moved in the Last Year: Not on file    Homeless in the Last Year: No  Utilities: Not At Risk (09/08/2023)   AHC Utilities    Threatened with loss of utilities: No  Health Literacy: Not on file    Vital Signs: Blood pressure 128/87, pulse 78, resp. rate 16, height 5' 7 (1.702 m), weight 264 lb (119.7 kg), SpO2 98%. Body mass index is 41.35 kg/m.    Examination: General Appearance: The patient is well-developed, well-nourished, and in  no distress. Neck Circumference: 44 cm Skin: Gross inspection of skin unremarkable. Head: normocephalic, no gross deformities. Eyes: no gross deformities noted. ENT: ears appear grossly normal Neurologic: Alert and oriented. No involuntary movements.  STOP BANG RISK ASSESSMENT S (snore) Have you been told that you snore?     NO   T (tired) Are you often tired, fatigued, or sleepy during the day?   NO  O (obstruction) Do you stop breathing, choke, or gasp during sleep? NO   P (pressure) Do you have or are you being treated for high blood pressure? YES   B (BMI) Is your body index greater than 35 kg/m? YES   A (age) Are you 52 years old or older? NO   N (neck) Do you have a neck circumference  greater than 16 inches?   YES   G (gender) Are you a female? NO   TOTAL STOP/BANG YES ANSWERS 3       A STOP-Bang score of 2 or less is considered low risk, and a score of 5 or more is high risk for having either moderate or severe OSA. For people who score 3 or 4, doctors may need to perform further assessment to determine how likely they are to have OSA.         EPWORTH SLEEPINESS SCALE:  Scale:  (0)= no chance of dozing; (1)= slight chance of dozing; (2)= moderate chance of dozing; (3)= high chance of dozing  Chance  Situtation    Sitting and reading: 2    Watching TV: 3    Sitting Inactive in public: 0    As a passenger in car: 0      Lying down to rest: 3    Sitting and talking: 0    Sitting quielty after lunch: 0    In a car, stopped in traffic: 0   TOTAL SCORE:   5 out of 24    SLEEP STUDIES:  PSG (12/2023) AHI 7.9/hr, REM/lateral AHI 19.5/hr, Supine AHI 17.6/hr, min SpO2 85% Titration (01/2024) APAP@ 6-12 cmH2O   CPAP COMPLIANCE DATA:  Date Range: 04/17/2024-05/16/2024  Average Daily Use: 5 hours 46 minutes  Median Use: 5 hours 28 minutes  Compliance for > 4 Hours: 73%  AHI: 1.0 respiratory events per hour  Days Used: 27/30 days  Mask Leak:  9.9  95th Percentile Pressure: 11.9         LABS: Recent Results (from the past 2160 hours)  Lipase, blood     Status: None   Collection Time: 04/17/24 12:41 PM  Result Value Ref Range   Lipase 23 11 - 51 U/L    Comment: Performed at [facility], [address] [place], [place] [address]  Comprehensive metabolic panel     Status: Abnormal   Collection Time: 04/17/24 12:41 PM  Result Value Ref Range   Sodium 138 135 - 145 mmol/L   Potassium 4.4 3.5 - 5.1 mmol/L   Chloride 103 98 - 111 mmol/L   CO2 23 22 - 32 mmol/L   Glucose, Bld 171 (H) 70 - 99 mg/dL    Comment: Glucose reference range applies only to samples taken after fasting for at least 8 hours.   BUN 10 6 - 20 mg/dL   Creatinine, Ser 9.23 0.44 - 1.00 mg/dL   Calcium 9.1 8.9 - 89.6 mg/dL   Total Protein 7.1 6.5 - 8.1 g/dL   Albumin 4.3 3.5 - 5.0 g/dL   AST 15 15 - 41 U/L   ALT 14 0 - 44 U/L   Alkaline Phosphatase 74 38 - 126 U/L   Total Bilirubin 0.7 0.0 - 1.2 mg/dL   GFR, Estimated >39 >39 mL/min    Comment: (NOTE) Calculated using the CKD-EPI Creatinine Equation (2021)    Anion gap 11 5 - 15    Comment: Performed at [facility], [address] [place], [place] [address]  CBC     Status: Abnormal   Collection Time: 04/17/24 12:41 PM  Result Value Ref Range   WBC 6.2 4.0 - 10.5 K/uL   RBC 5.58 (H) 3.87 - 5.11 MIL/uL   Hemoglobin 14.7 12.0 - 15.0 g/dL   HCT 53.6 (H) 63.9 - 53.9 %   MCV 83.0 80.0 - 100.0 fL   MCH 26.3  26.0 - 34.0 pg   MCHC 31.7 30.0 - 36.0 g/dL   RDW 86.2 88.4 - 84.4 %   Platelets 296 150 - 400 K/uL   nRBC 0.0 0.0 - 0.2 %    Comment: Performed at [facility], [address] [place], [place] [address]  Urinalysis, Routine w reflex microscopic -Urine, Clean Catch     Status: Abnormal   Collection Time: 04/17/24  2:00 PM  Result Value Ref Range   Color, Urine YELLOW (A) YELLOW   APPearance CLEAR (A) CLEAR   Specific Gravity, Urine 1.030 1.005 - 1.030    pH 6.0 5.0 - 8.0   Glucose, UA >=500 (A) NEGATIVE mg/dL   Hgb urine dipstick NEGATIVE NEGATIVE   Bilirubin Urine NEGATIVE NEGATIVE   Ketones, ur NEGATIVE NEGATIVE mg/dL   Protein, ur NEGATIVE NEGATIVE mg/dL   Nitrite NEGATIVE NEGATIVE   Leukocytes,Ua NEGATIVE NEGATIVE   RBC / HPF 0-5 0 - 5 RBC/hpf   WBC, UA 0-5 0 - 5 WBC/hpf   Bacteria, UA RARE (A) NONE SEEN   Squamous Epithelial / HPF 0-5 0 - 5 /HPF    Comment: Performed at [facility], [address] [place], [place] [address]  POC urine preg, ED     Status: None   Collection Time: 04/17/24  2:30 PM  Result Value Ref Range   Preg Test, Ur NEGATIVE NEGATIVE    Comment:        THE SENSITIVITY OF THIS METHODOLOGY IS >20 mIU/mL.     Radiology: CT ABDOMEN PELVIS W CONTRAST Result Date: 04/17/2024 CLINICAL DATA:  Constipation with bloating and abdominal pain EXAM: CT ABDOMEN AND PELVIS WITH CONTRAST TECHNIQUE: Multidetector CT imaging of the abdomen and pelvis was performed using the standard protocol following bolus administration of intravenous contrast. RADIATION DOSE REDUCTION: This exam was performed according to the departmental dose-optimization program which includes automated exposure control, adjustment of the mA and/or kV according to patient size and/or use of iterative reconstruction technique. CONTRAST:  OMNIPAQUE  IOHEXOL  300 MG/ML  SOLN COMPARISON:  CT abdomen and pelvis dated 11/11/2022 FINDINGS: Lower chest: No focal consolidation or pulmonary nodule in the lung bases. No pleural effusion or pneumothorax demonstrated. Partially imaged heart size is normal. Hepatobiliary: Diffuse parenchymal hypoattenuation can be seen with hepatic steatosis. No intra or extrahepatic biliary ductal dilation. Normal gallbladder. Pancreas: No focal lesions or main ductal dilation. Spleen: Normal in size without focal abnormality. Adrenals/Urinary Tract: No adrenal nodules. Decreased size of prior ablation change at the posterior  interpolar right kidney. No suspicious renal mass, calculi or hydronephrosis. No focal bladder wall thickening. Stomach/Bowel: Normal appearance of the stomach. No evidence of bowel wall thickening, distention, or inflammatory changes. Small volume stool throughout the colon. Normal appendix. Vascular/Lymphatic: No significant vascular findings are present. No enlarged abdominal or pelvic lymph nodes. Reproductive: No adnexal masses. Other: No free fluid, fluid collection, or free air. Rounded peripherally calcified focus anterior to the rectum (2:83), likely sequela of prior infection/inflammation. Musculoskeletal: No acute or abnormal lytic or blastic osseous lesions. Postsurgical changes of prior cesarean section. IMPRESSION: 1. No acute abdominopelvic findings. 2. Small volume stool throughout the colon. 3. Hepatic steatosis. Electronically Signed   By: Limin  Xu M.D.   On: 04/17/2024 15:35    No results found.  No results found.    Assessment and Plan: Patient Active Problem List   Diagnosis Date Noted   CPAP use counseling 05/21/2024   OSA (obstructive sleep apnea) 01/02/2024  Obesity (BMI 30-39.9) 01/02/2024   PTSD (post-traumatic stress disorder) 09/01/2020   Type 2 diabetes mellitus with hyperglycemia, without long-term current use of insulin (HCC) 09/01/2020   Hypertension 07/29/2017   Mixed anxiety depressive disorder 07/29/2017   1. OSA (obstructive sleep apnea) (Primary) The patient does tolerate PAP and reports  benefit from PAP use. The patient was reminded how to clean equipment and advised to replace supplies routinely. The patient was also counselled on weight loss. The compliance is good. The AHI is 1.0.   OSA on cpap- controlled. Continue with compliance with pap. CPAP continues to be medically necessary to treat this patient's OSA. F/u 75m   2. CPAP use counseling CPAP Counseling: had a lengthy discussion with the patient regarding the importance of PAP therapy in  management of the sleep apnea. Patient appears to understand the risk factor reduction and also understands the risks associated with untreated sleep apnea. Patient will try to make a good faith effort to remain compliant with therapy. Also instructed the patient on proper cleaning of the device including the water must be changed daily if possible and use of distilled water is preferred. Patient understands that the machine should be regularly cleaned with appropriate recommended cleaning solutions that do not damage the PAP machine for example given white vinegar and water rinses. Other methods such as ozone treatment may not be as good as these simple methods to achieve cleaning.   3. Obesity (BMI 30-39.9) Obesity Counseling: Had a lengthy discussion regarding patients BMI and weight issues. Patient was instructed on portion control as well as increased activity. Also discussed caloric restrictions with trying to maintain intake less than 2000 Kcal. Discussions were made in accordance with the 5As of weight management. Simple actions such as not eating late and if able to, taking a walk is suggested.      General Counseling: I have discussed the findings of the evaluation and examination with Amaliya.  I have also discussed any further diagnostic evaluation thatmay be needed or ordered today. Musette verbalizes understanding of the findings of todays visit. We also reviewed her medications today and discussed drug interactions and side effects including but not limited excessive drowsiness and altered mental states. We also discussed that there is always a risk not just to her but also people around her. she has been encouraged to call the office with any questions or concerns that should arise related to todays visit.  No orders of the defined types were placed in this encounter.       I have personally obtained a history, examined the patient, evaluated laboratory and imaging results, formulated  the assessment and plan and placed orders. This patient was seen today by Lauraine Lay, PA-C in collaboration with Dr. Elfreda Bathe.   Elfreda DELENA Bathe, MD [facility] Diplomate ABMS Pulmonary Critical Care Medicine and Sleep Medicine

## 2024-05-21 ENCOUNTER — Ambulatory Visit: Admitting: Internal Medicine

## 2024-05-21 VITALS — BP 128/87 | HR 78 | Resp 16 | Ht 67.0 in | Wt 264.0 lb

## 2024-05-21 DIAGNOSIS — E669 Obesity, unspecified: Secondary | ICD-10-CM | POA: Diagnosis not present

## 2024-05-21 DIAGNOSIS — Z7189 Other specified counseling: Secondary | ICD-10-CM | POA: Insufficient documentation

## 2024-05-21 DIAGNOSIS — G4733 Obstructive sleep apnea (adult) (pediatric): Secondary | ICD-10-CM | POA: Diagnosis not present

## 2024-05-21 NOTE — Patient Instructions (Signed)
